# Patient Record
Sex: Female | Born: 1990 | Race: White | Hispanic: No | Marital: Single | Smoking: Current some day smoker
Health system: Southern US, Community
[De-identification: ages and names within clinical notes are randomized; demographics above are authoritative.]

## PROBLEM LIST (undated history)

## (undated) DIAGNOSIS — F419 Anxiety disorder, unspecified: Secondary | ICD-10-CM

## (undated) DIAGNOSIS — F1990 Other psychoactive substance use, unspecified, uncomplicated: Secondary | ICD-10-CM

## (undated) DIAGNOSIS — K8020 Calculus of gallbladder without cholecystitis without obstruction: Secondary | ICD-10-CM

## (undated) DIAGNOSIS — I10 Essential (primary) hypertension: Secondary | ICD-10-CM

---

## 2014-05-30 DIAGNOSIS — I10 Essential (primary) hypertension: Secondary | ICD-10-CM | POA: Insufficient documentation

## 2014-05-30 DIAGNOSIS — E669 Obesity, unspecified: Secondary | ICD-10-CM | POA: Insufficient documentation

## 2014-05-30 DIAGNOSIS — F419 Anxiety disorder, unspecified: Secondary | ICD-10-CM | POA: Insufficient documentation

## 2014-05-30 DIAGNOSIS — F17210 Nicotine dependence, cigarettes, uncomplicated: Secondary | ICD-10-CM | POA: Insufficient documentation

## 2014-05-30 DIAGNOSIS — J309 Allergic rhinitis, unspecified: Secondary | ICD-10-CM | POA: Insufficient documentation

## 2015-12-18 ENCOUNTER — Encounter (HOSPITAL_BASED_OUTPATIENT_CLINIC_OR_DEPARTMENT_OTHER): Payer: Self-pay | Admitting: Emergency Medicine

## 2015-12-18 ENCOUNTER — Emergency Department (HOSPITAL_BASED_OUTPATIENT_CLINIC_OR_DEPARTMENT_OTHER)
Admission: EM | Admit: 2015-12-18 | Discharge: 2015-12-18 | Disposition: A | Discharge status: Home / Self Care | Payer: Self-pay | Attending: Emergency Medicine | Admitting: Emergency Medicine

## 2015-12-18 DIAGNOSIS — F17210 Nicotine dependence, cigarettes, uncomplicated: Secondary | ICD-10-CM | POA: Insufficient documentation

## 2015-12-18 DIAGNOSIS — Z3202 Encounter for pregnancy test, result negative: Secondary | ICD-10-CM | POA: Insufficient documentation

## 2015-12-18 DIAGNOSIS — K5900 Constipation, unspecified: Secondary | ICD-10-CM | POA: Insufficient documentation

## 2015-12-18 DIAGNOSIS — R509 Fever, unspecified: Secondary | ICD-10-CM | POA: Insufficient documentation

## 2015-12-18 DIAGNOSIS — R112 Nausea with vomiting, unspecified: Secondary | ICD-10-CM | POA: Insufficient documentation

## 2015-12-18 DIAGNOSIS — R197 Diarrhea, unspecified: Secondary | ICD-10-CM | POA: Insufficient documentation

## 2015-12-18 DIAGNOSIS — R1012 Left upper quadrant pain: Secondary | ICD-10-CM | POA: Insufficient documentation

## 2015-12-18 LAB — COMPREHENSIVE METABOLIC PANEL
ALK PHOS: 66 U/L (ref 38–126)
ALT: 40 U/L (ref 14–54)
AST: 25 U/L (ref 15–41)
Albumin: 4 g/dL (ref 3.5–5.0)
Anion gap: 6 (ref 5–15)
BILIRUBIN TOTAL: 0.5 mg/dL (ref 0.3–1.2)
BUN: 16 mg/dL (ref 6–20)
CALCIUM: 8.8 mg/dL — AB (ref 8.9–10.3)
CO2: 24 mmol/L (ref 22–32)
CREATININE: 0.58 mg/dL (ref 0.44–1.00)
Chloride: 107 mmol/L (ref 101–111)
GFR calc non Af Amer: >60 mL/min (ref >60)
Glucose, Bld: 113 mg/dL — ABNORMAL HIGH (ref 65–99)
Potassium: 3.9 mmol/L (ref 3.5–5.1)
SODIUM: 137 mmol/L (ref 135–145)
Total Protein: 7.3 g/dL (ref 6.5–8.1)

## 2015-12-18 LAB — URINALYSIS, ROUTINE W REFLEX MICROSCOPIC
Bilirubin Urine: NEGATIVE
Glucose, UA: NEGATIVE mg/dL
KETONES UR: NEGATIVE mg/dL
NITRITE: NEGATIVE
PROTEIN: NEGATIVE mg/dL
Specific Gravity, Urine: 1.022 (ref 1.005–1.030)
pH: 5.5 (ref 5.0–8.0)

## 2015-12-18 LAB — URINE MICROSCOPIC-ADD ON

## 2015-12-18 LAB — CBC WITH DIFFERENTIAL/PLATELET
BASOS PCT: 0 %
Band Neutrophils: 0 %
Basophils Absolute: 0 10*3/uL (ref 0.0–0.1)
Blasts: 0 %
EOS ABS: 0.1 10*3/uL (ref 0.0–0.7)
Eosinophils Relative: 2 %
HCT: 37 % (ref 36.0–46.0)
HEMOGLOBIN: 13.5 g/dL (ref 12.0–15.0)
Lymphocytes Relative: 41 %
Lymphs Abs: 2.3 10*3/uL (ref 0.7–4.0)
MCH: 28.7 pg (ref 26.0–34.0)
MCHC: 36.5 g/dL — ABNORMAL HIGH (ref 30.0–36.0)
MCV: 78.7 fL (ref 78.0–100.0)
MONO ABS: 0.2 10*3/uL (ref 0.1–1.0)
MYELOCYTES: 0 %
Metamyelocytes Relative: 0 %
Monocytes Relative: 3 %
NEUTROS PCT: 54 %
NRBC: 0 /100{WBCs}
Neutro Abs: 2.9 10*3/uL (ref 1.7–7.7)
Other: 0 %
PROMYELOCYTES ABS: 0 %
Platelets: 158 10*3/uL (ref 150–400)
RBC: 4.7 MIL/uL (ref 3.87–5.11)
RDW: 12.8 % (ref 11.5–15.5)
WBC: 5.5 10*3/uL (ref 4.0–10.5)

## 2015-12-18 LAB — LIPASE, BLOOD: LIPASE: 22 U/L (ref 11–51)

## 2015-12-18 LAB — PREGNANCY, URINE: PREG TEST UR: NEGATIVE

## 2015-12-18 MED ORDER — OMEPRAZOLE 20 MG PO CPDR: 20.0000 mg | DELAYED_RELEASE_CAPSULE | Freq: Every day | ORAL | Status: DC

## 2015-12-18 MED FILL — OMEPRAZOLE DR 20 MG CAPSULE: 20 | 30 days supply | Qty: 30 | Fill #0

## 2015-12-18 NOTE — Discharge Instructions (Signed)

## 2015-12-18 NOTE — ED Notes (Signed)
Pt made aware to return if symptoms worsen or if any life threatening symptoms occur.   

## 2015-12-18 NOTE — ED Notes (Signed)
Pt reports left sided abdominal pain radiating to left flank, nausea with vomiting occasionally since last march after she gave birth to daughter.  Pt believes she has gall stones but has not been diagnosed.

## 2015-12-18 NOTE — ED Provider Notes (Signed)
CSN: [id number]     Arrival date & time 12/18/15  0914 History   First MD Initiated Contact with Patient 12/18/15 [phone]     Chief Complaint  Patient presents with  . Abdominal Pain     Patient is a 25 y.o. female presenting with abdominal pain. The history is provided by the patient. No language interpreter was used.  Abdominal Pain  Nichole Curry is a 25 y.o. female who presents to the Emergency Department complaining of abdominal pain.  She reports 1 year of left upper quadrant abdominal pain. The pain is waxing and waning and has been significantly worse over the last week. The pain is described as sharp and rocklike sensation. It starts in her left upper quadrant and radiates through to her back at times. She has nausea and at times makes her self vomit to relieve the nausea. She has occasional diarrhea and constipation. No dysuria. Pain is at times worse after he see or greasy foods. She was [facility] ago and had an exam performed and was started on omeprazole. She has occasional relief when she takes this medicine but only takes it as needed. No chest pain, cough. She's been feeling poorly over the last week and has subjective fever and chills.  History reviewed. No pertinent past medical history. History reviewed. No pertinent past surgical history. History reviewed. No pertinent family history. Social History  Substance Use Topics  . Smoking status: Current Some Day Smoker -- 0.50 packs/day    Types: Cigarettes  . Smokeless tobacco: None  . Alcohol Use: No   OB History    No data available     Review of Systems  Gastrointestinal: Positive for abdominal pain.  All other systems reviewed and are negative.     Allergies  Review of patient's allergies indicates no known allergies.  Home Medications   Prior to Admission medications   Medication Sig Start Date End Date Taking? Authorizing Provider  omeprazole (PRILOSEC) 20 MG capsule Take 1 capsule (20 mg  total) by mouth daily. 12/18/15   Tilden FossaElizabeth Labrisha Wuellner, MD   BP 146/102 mmHg  Pulse 88  Temp(Src) 98.2 F (36.8 C) (Oral)  Resp 16  Ht 5\' 1"  (1.549 m)  Wt 219 lb 7 oz (99.536 kg)  BMI 41.48 kg/m2  SpO2 100%  LMP 12/08/2015 (Exact Date) Physical Exam  Constitutional: She is oriented to person, place, and time. She appears well-developed and well-nourished.  HENT:  Head: Normocephalic and atraumatic.  Cardiovascular: Normal rate and regular rhythm.   No murmur heard. Pulmonary/Chest: Effort normal and breath sounds normal. No respiratory distress.  Abdominal: Soft. There is no rebound and no guarding.  Obese abdomen with mild left upper quadrant tenderness.  Musculoskeletal: She exhibits no edema or tenderness.  Neurological: She is alert and oriented to person, place, and time.  Skin: Skin is warm and dry.  Psychiatric: She has a normal mood and affect. Her behavior is normal.  Nursing note and vitals reviewed.   ED Course  Procedures (including critical care time) Labs Review Labs Reviewed  COMPREHENSIVE METABOLIC PANEL - Abnormal; Notable for the following:    Glucose, Bld 113 (*)    Calcium 8.8 (*)    All other components within normal limits  CBC WITH DIFFERENTIAL/PLATELET - Abnormal; Notable for the following:    MCHC 36.5 (*)    All other components within normal limits  URINALYSIS, ROUTINE W REFLEX MICROSCOPIC (NOT [facility]) - Abnormal; Notable for the following:  APPearance CLOUDY (*)    Hgb urine dipstick TRACE (*)    Leukocytes, UA SMALL (*)    All other components within normal limits  URINE MICROSCOPIC-ADD ON - Abnormal; Notable for the following:    Squamous Epithelial / LPF 6-30 (*)    Bacteria, UA MANY (*)    All other components within normal limits  URINE CULTURE  LIPASE, BLOOD  PREGNANCY, URINE    Imaging Review No results found. I have personally reviewed and evaluated these images and lab results as part of my medical decision-making.   EKG  Interpretation None      MDM   Final diagnoses:  LUQ abdominal pain    Patient here for evaluation of left upper quadrant pain that has been intermittent for last year. Her abdominal examination is benign. UA is with contaminant, no evidence of UTI, culture is pending. Patient with possible gastritis, treated with omeprazole. Discussed outpatient follow-up, home care, return precautions.  Presentation not consistent with cholecystitis, pancreatitis, bowel obstruction, diverticulitis.    Tilden Fossa, MD 12/19/15 [phone]

## 2015-12-19 LAB — URINE CULTURE: CULTURE: NO GROWTH

## 2016-09-09 ENCOUNTER — Encounter (HOSPITAL_BASED_OUTPATIENT_CLINIC_OR_DEPARTMENT_OTHER): Payer: Self-pay

## 2016-09-09 ENCOUNTER — Emergency Department (HOSPITAL_BASED_OUTPATIENT_CLINIC_OR_DEPARTMENT_OTHER)
Admission: EM | Admit: 2016-09-09 | Discharge: 2016-09-09 | Disposition: A | Discharge status: Home / Self Care | Payer: Medicaid Other | Attending: Emergency Medicine | Admitting: Emergency Medicine

## 2016-09-09 DIAGNOSIS — F17210 Nicotine dependence, cigarettes, uncomplicated: Secondary | ICD-10-CM | POA: Insufficient documentation

## 2016-09-09 DIAGNOSIS — N390 Urinary tract infection, site not specified: Secondary | ICD-10-CM | POA: Insufficient documentation

## 2016-09-09 LAB — URINALYSIS, ROUTINE W REFLEX MICROSCOPIC
Bilirubin Urine: NEGATIVE
Glucose, UA: NEGATIVE mg/dL
KETONES UR: NEGATIVE mg/dL
NITRITE: POSITIVE — AB
PROTEIN: 30 mg/dL — AB
Specific Gravity, Urine: 1.028 (ref 1.005–1.030)
pH: 6 (ref 5.0–8.0)

## 2016-09-09 LAB — URINALYSIS, MICROSCOPIC (REFLEX)

## 2016-09-09 LAB — PREGNANCY, URINE: Preg Test, Ur: NEGATIVE

## 2016-09-09 MED ORDER — NITROFURANTOIN MONOHYD MACRO 100 MG PO CAPS: 100.0000 mg | ORAL_CAPSULE | Freq: Two times a day (BID) | ORAL | 0 refills | Status: DC

## 2016-09-09 MED ORDER — PHENAZOPYRIDINE HCL 100 MG PO TABS
200.0000 mg | ORAL_TABLET | Freq: Once | ORAL | Status: AC
  Administered 2016-09-09: 200 mg via ORAL
  Filled 2016-09-09: qty 2

## 2016-09-09 MED ORDER — NITROFURANTOIN MONOHYD MACRO 100 MG PO CAPS
100.0000 mg | ORAL_CAPSULE | Freq: Once | ORAL | Status: AC
  Administered 2016-09-09: 100 mg via ORAL
  Filled 2016-09-09: qty 1

## 2016-09-09 MED ORDER — PHENAZOPYRIDINE HCL 200 MG PO TABS: 200.0000 mg | ORAL_TABLET | Freq: Three times a day (TID) | ORAL | 0 refills | Status: DC

## 2016-09-09 NOTE — ED Triage Notes (Signed)
Pt c/o dysuria for the last four days with urinary frequency and bladder pressure.  She denies flank pain, denies fevers, states it feels like when she has had a UTI in the past.

## 2016-09-09 NOTE — ED Notes (Signed)
Pt verbalizes understanding of d/c instructions and denies any further needs at this time. 

## 2016-09-09 NOTE — ED Provider Notes (Signed)
[facility] Provider Note: Nichole DellJ. Lane Benedetto Ryder, MD, FACEP  CSN: [id number] MRN: [id number] ARRIVAL: 09/09/16 at 0209 ROOM: MH11/MH11   CHIEF COMPLAINT  Dysuria   HISTORY OF PRESENT ILLNESS  Nichole Curry is a 26 y.o. female with a 4-6 day history of lower abdominal pain, urinary urgency, urinary frequency and voiding small amounts. She has also noticed some mild hematuria. She denies back pain, flank pain, fever, chills, vomiting or diarrhea but has had nausea. Symptoms are moderate. She has not taken anything for this. Symptoms are similar to previous urinary tract infections.   History reviewed. No pertinent past medical history.  History reviewed. No pertinent surgical history.  No family history on file.  Social History  Substance Use Topics  . Smoking status: Current Some Day Smoker    Packs/day: 0.50    Types: Cigarettes  . Smokeless tobacco: Not on file  . Alcohol use No    Prior to Admission medications   Medication Sig Start Date End Date Taking? Authorizing Provider  nitrofurantoin, macrocrystal-monohydrate, (MACROBID) 100 MG capsule Take 1 capsule (100 mg total) by mouth 2 (two) times daily. X 7 days 09/09/16   Paula LibraJohn Linell Shawn, MD  omeprazole (PRILOSEC) 20 MG capsule Take 1 capsule (20 mg total) by mouth daily. 12/18/15   Tilden FossaElizabeth Rees, MD    Allergies Patient has no known allergies.   REVIEW OF SYSTEMS  Negative except as noted here or in the History of Present Illness.   PHYSICAL EXAMINATION  Initial Vital Signs Blood pressure 136/66, pulse 92, temperature 98.7 F (37.1 C), temperature source Oral, height 5\' 1"  (1.549 m), weight 215 lb (97.5 kg), last menstrual period 08/27/2016, SpO2 100 %.  Examination General: Well-developed, well-nourished female in no acute distress; appearance consistent with age of record HENT: normocephalic; atraumatic Eyes: pupils equal, round and reactive to light; extraocular muscles intact Neck: supple Heart: regular  rate and rhythm Lungs: clear to auscultation bilaterally Abdomen: soft; nondistended; nontender; no masses or hepatosplenomegaly; bowel sounds present GU: No CVA tenderness Extremities: No deformity; full range of motion; pulses normal Neurologic: Awake, alert and oriented; motor function intact in all extremities and symmetric; no facial droop Skin: Warm and dry Psychiatric: Normal mood and affect   RESULTS  Summary of this visit's results, reviewed by myself:   EKG Interpretation  Date/Time:    Ventricular Rate:    PR Interval:    QRS Duration:   QT Interval:    QTC Calculation:   R Axis:     Text Interpretation:        Laboratory Studies: Results for orders placed or performed during the hospital encounter of 09/09/16 (from the past 24 hour(s))  Urinalysis, Routine w reflex microscopic     Status: Abnormal   Collection Time: 09/09/16  2:18 AM  Result Value Ref Range   Color, Urine YELLOW YELLOW   APPearance CLOUDY (A) CLEAR   Specific Gravity, Urine 1.028 1.005 - 1.030   pH 6.0 5.0 - 8.0   Glucose, UA NEGATIVE NEGATIVE mg/dL   Hgb urine dipstick MODERATE (A) NEGATIVE   Bilirubin Urine NEGATIVE NEGATIVE   Ketones, ur NEGATIVE NEGATIVE mg/dL   Protein, ur 30 (A) NEGATIVE mg/dL   Nitrite POSITIVE (A) NEGATIVE   Leukocytes, UA LARGE (A) NEGATIVE  Pregnancy, urine     Status: None   Collection Time: 09/09/16  2:18 AM  Result Value Ref Range   Preg Test, Ur NEGATIVE NEGATIVE  Urinalysis, Microscopic (reflex)     Status:  Abnormal   Collection Time: 09/09/16  2:18 AM  Result Value Ref Range   RBC / HPF 6-30 0 - 5 RBC/hpf   WBC, UA TOO NUMEROUS TO COUNT 0 - 5 WBC/hpf   Bacteria, UA MANY (A) NONE SEEN   Squamous Epithelial / LPF 0-5 (A) NONE SEEN   Imaging Studies: No results found.  ED COURSE  Nursing notes and initial vitals signs, including pulse oximetry, reviewed.  Vitals:   09/09/16 0216 09/09/16 0217  BP: 136/66   Pulse: 92   Temp: 98.7 F (37.1 C)     TempSrc: Oral   SpO2: 100%   Weight:  215 lb (97.5 kg)  Height:  5\' 1"  (1.549 m)    PROCEDURES    ED DIAGNOSES     ICD-9-CM ICD-10-CM   1. Lower urinary tract infectious disease 599.0 N39.0        [name], MD 09/09/16 [phone]

## 2016-09-11 LAB — URINE CULTURE: Culture: >=100000 — AB

## 2016-09-12 ENCOUNTER — Telehealth (HOSPITAL_BASED_OUTPATIENT_CLINIC_OR_DEPARTMENT_OTHER): Payer: Self-pay | Admitting: *Deleted

## 2016-09-12 NOTE — Telephone Encounter (Signed)
Post ED Visit - Positive Culture Follow-up  Culture report reviewed by antimicrobial stewardship pharmacist:  []  Enzo BiNathan Batchelder, Pharm.D. []  Celedonio MiyamotoJeremy Frens, Pharm.D., BCPS []  Garvin FilaMike Maccia, Pharm.D. []  Georgina PillionElizabeth Martin, Pharm.D., BCPS []  SheffieldMinh Pham, [address]Ph[address]D., BCPS, AAHIVP []  Estella HuskMichelle Turner, Pharm.D., BCPS, AAHIVP []  Tennis Mustassie Stewart, Pharm.D. []  Sherle Poeob Vincent, [address]Ph[address] Maggie Shuda, Pharm D  Positive urine culture Treated with Nitrofurantoin Monohyd Macro, organism sensitive to the same and no further patient follow-up is required at this time.  Virl AxeRobertson, Ej Pinson [facility] 09/12/2016, 9:19 AM

## 2018-01-21 ENCOUNTER — Emergency Department (HOSPITAL_BASED_OUTPATIENT_CLINIC_OR_DEPARTMENT_OTHER)
Admission: EM | Admit: 2018-01-21 | Discharge: 2018-01-22 | Disposition: A | Discharge status: Home / Self Care | Payer: Medicaid Other | Attending: Emergency Medicine | Admitting: Emergency Medicine

## 2018-01-21 ENCOUNTER — Other Ambulatory Visit: Payer: Self-pay

## 2018-01-21 ENCOUNTER — Encounter (HOSPITAL_BASED_OUTPATIENT_CLINIC_OR_DEPARTMENT_OTHER): Payer: Self-pay | Admitting: *Deleted

## 2018-01-21 ENCOUNTER — Emergency Department (HOSPITAL_BASED_OUTPATIENT_CLINIC_OR_DEPARTMENT_OTHER): Payer: Medicaid Other

## 2018-01-21 DIAGNOSIS — F17210 Nicotine dependence, cigarettes, uncomplicated: Secondary | ICD-10-CM | POA: Insufficient documentation

## 2018-01-21 DIAGNOSIS — Z79899 Other long term (current) drug therapy: Secondary | ICD-10-CM | POA: Diagnosis not present

## 2018-01-21 DIAGNOSIS — M545 Low back pain, unspecified: Secondary | ICD-10-CM

## 2018-01-21 LAB — URINALYSIS, ROUTINE W REFLEX MICROSCOPIC
Bilirubin Urine: NEGATIVE
Glucose, UA: NEGATIVE mg/dL
Ketones, ur: NEGATIVE mg/dL
LEUKOCYTES UA: NEGATIVE
Nitrite: NEGATIVE
PROTEIN: NEGATIVE mg/dL
SPECIFIC GRAVITY, URINE: 1.025 (ref 1.005–1.030)
pH: 6 (ref 5.0–8.0)

## 2018-01-21 LAB — URINALYSIS, MICROSCOPIC (REFLEX)

## 2018-01-21 LAB — PREGNANCY, URINE: PREG TEST UR: NEGATIVE

## 2018-01-21 NOTE — ED Provider Notes (Signed)
[facility] EMERGENCY DEPARTMENT Provider Note   CSN: [id number] Arrival date & time: 01/21/18  2132     History   Chief Complaint Chief Complaint  Patient presents with  . Back Pain    HPI Nichole Curry is a 27 y.o. female.  The history is provided by the patient. No language interpreter was used.  Back Pain   This is a new problem. The problem occurs constantly. The problem has been gradually worsening. The pain is associated with falling. The pain is present in the sacro-iliac joint. The quality of the pain is described as aching. The pain does not radiate. The pain is moderate. Pertinent negatives include no paresthesias and no paresis. She has tried nothing for the symptoms.  Pt fell a week ago and landed on her bottom,  Pt reports she has a knot and is still having pain   History reviewed. No pertinent past medical history.  There are no active problems to display for this patient.   Past Surgical History:  Procedure Laterality Date  . CESAREAN SECTION       OB History   None      Home Medications    Prior to Admission medications   Medication Sig Start Date End Date Taking? Authorizing Provider  nitrofurantoin, macrocrystal-monohydrate, (MACROBID) 100 MG capsule Take 1 capsule (100 mg total) by mouth 2 (two) times daily. X 7 days 09/09/16   Molpus, John, MD  omeprazole (PRILOSEC) 20 MG capsule Take 1 capsule (20 mg total) by mouth daily. 12/18/15   Tilden Fossa, MD  phenazopyridine (PYRIDIUM) 200 MG tablet Take 1 tablet (200 mg total) by mouth 3 (three) times daily. 09/09/16   Molpus, John, MD    Family History No family history on file.  Social History Social History   Tobacco Use  . Smoking status: Current Some Day Smoker    Packs/day: 0.50    Types: Cigarettes  . Smokeless tobacco: Never Used  Substance Use Topics  . Alcohol use: Yes    Comment: occasional  . Drug use: No     Allergies   Patient has no known  allergies.   Review of Systems Review of Systems  Musculoskeletal: Positive for back pain.  Neurological: Negative for paresthesias.  All other systems reviewed and are negative.    Physical Exam Updated Vital Signs BP 128/73 (BP Location: Right Arm)   Pulse (!) 104   Temp 98.4 F (36.9 C) (Oral)   Resp 20   Ht  (1.549 m)   Wt 98.9 kg (218 lb 0.6 oz)   LMP 01/12/2018   SpO2 98%   BMI 41.20 kg/m   Physical Exam  Constitutional: She appears well-developed and well-nourished.  Eyes: Pupils are equal, round, and reactive to light.  Cardiovascular: Normal rate.  Pulmonary/Chest: Effort normal.  Abdominal: Soft.  Musculoskeletal: Normal range of motion.  Neurological: She is alert.  Skin: Skin is warm.  Psychiatric: She has a normal mood and affect.  Nursing note and vitals reviewed.    ED Treatments / Results  Labs (all labs ordered are listed, but only abnormal results are displayed) Labs Reviewed  URINALYSIS, ROUTINE W REFLEX MICROSCOPIC - Abnormal; Notable for the following components:      Result Value   APPearance HAZY (*)    Hgb urine dipstick TRACE (*)    All other components within normal limits  URINALYSIS, MICROSCOPIC (REFLEX) - Abnormal; Notable for the following components:   Bacteria, UA MANY (*)  All other components within normal limits  PREGNANCY, URINE    EKG None  Radiology Dg Sacrum/coccyx  Result Date: 01/21/2018 CLINICAL DATA:  Fall 7-10 days ago with low back pain. EXAM: SACRUM AND COCCYX - 2+ VIEW COMPARISON:  None. FINDINGS: There is no evidence of fracture or other focal bone lesions. SI joints are symmetric and unremarkable. IMPRESSION: Negative. Electronically Signed   By: Charlett Nose M.D.   On: 01/21/2018 23:54    Procedures Procedures (including critical care time)  Medications Ordered in ED Medications - No data to display   Initial Impression / Assessment and Plan / ED Course  I have reviewed the triage vital  signs and the nursing notes.  Pertinent labs & imaging results that were available during my care of the patient were reviewed by me and considered in my medical decision making (see chart for details).     Sacraum and coccys no fracture.   Final Clinical Impressions(s) / ED Diagnoses   Final diagnoses:  Low back pain without sciatica, unspecified back pain laterality, unspecified chronicity    ED Discharge Orders        Ordered    diclofenac (VOLTAREN) 75 MG EC tablet  2 times daily     01/22/18 0004    methocarbamol (ROBAXIN) 500 MG tablet  2 times daily     01/22/18 0004    An After Visit Summary was printed and given to the patient.    Elson Areas, PA-C 01/22/18 0005    Shon Baton, MD 01/22/18 [phone]

## 2018-01-21 NOTE — ED Triage Notes (Signed)
Pt states she had a hard fall 1 week ago and landed on a tile floor. States pain is worse now than after the initial injury. Ambulatory to triage

## 2018-01-22 MED ORDER — DICLOFENAC SODIUM 75 MG PO TBEC: 75.0000 mg | DELAYED_RELEASE_TABLET | Freq: Two times a day (BID) | ORAL | 0 refills | Status: DC

## 2018-01-22 MED ORDER — METHOCARBAMOL 500 MG PO TABS
750.0000 mg | ORAL_TABLET | Freq: Once | ORAL | Status: AC
  Administered 2018-01-22: 750 mg via ORAL
  Filled 2018-01-22: qty 2

## 2018-01-22 MED ORDER — METHOCARBAMOL 500 MG PO TABS: 500.0000 mg | ORAL_TABLET | Freq: Two times a day (BID) | ORAL | 0 refills | Status: DC

## 2018-01-22 MED ORDER — IBUPROFEN 800 MG PO TABS
800.0000 mg | ORAL_TABLET | Freq: Once | ORAL | Status: AC
  Administered 2018-01-22: 800 mg via ORAL
  Filled 2018-01-22: qty 1

## 2018-01-22 NOTE — Discharge Instructions (Addendum)
See your Physician for recheck in 3-4 days °

## 2018-05-10 ENCOUNTER — Encounter (HOSPITAL_BASED_OUTPATIENT_CLINIC_OR_DEPARTMENT_OTHER): Payer: Self-pay

## 2018-05-10 ENCOUNTER — Other Ambulatory Visit: Payer: Self-pay

## 2018-05-10 DIAGNOSIS — J029 Acute pharyngitis, unspecified: Secondary | ICD-10-CM | POA: Insufficient documentation

## 2018-05-10 DIAGNOSIS — Z5321 Procedure and treatment not carried out due to patient leaving prior to being seen by health care provider: Secondary | ICD-10-CM | POA: Insufficient documentation

## 2018-05-10 DIAGNOSIS — R0981 Nasal congestion: Secondary | ICD-10-CM | POA: Insufficient documentation

## 2018-05-10 DIAGNOSIS — R05 Cough: Secondary | ICD-10-CM | POA: Insufficient documentation

## 2018-05-10 NOTE — ED Triage Notes (Signed)
Pt c/o cough, congestion, and sore throat since last Friday. Pt A+OX4, NAD.

## 2018-05-11 ENCOUNTER — Emergency Department (HOSPITAL_BASED_OUTPATIENT_CLINIC_OR_DEPARTMENT_OTHER)
Admission: EM | Admit: 2018-05-11 | Discharge: 2018-05-11 | Disposition: A | Discharge status: Home / Self Care | Payer: Medicaid Other | Attending: Emergency Medicine | Admitting: Emergency Medicine

## 2018-05-11 LAB — GROUP A STREP BY PCR: GROUP A STREP BY PCR: NOT DETECTED

## 2018-05-11 NOTE — ED Notes (Signed)
Patient left prior to being seen by EDP.

## 2019-02-27 DIAGNOSIS — F411 Generalized anxiety disorder: Secondary | ICD-10-CM | POA: Insufficient documentation

## 2019-02-27 DIAGNOSIS — F331 Major depressive disorder, recurrent, moderate: Secondary | ICD-10-CM | POA: Insufficient documentation

## 2019-02-27 DIAGNOSIS — F1120 Opioid dependence, uncomplicated: Secondary | ICD-10-CM | POA: Insufficient documentation

## 2019-10-23 ENCOUNTER — Encounter (HOSPITAL_BASED_OUTPATIENT_CLINIC_OR_DEPARTMENT_OTHER): Payer: Self-pay

## 2019-10-23 ENCOUNTER — Emergency Department (HOSPITAL_BASED_OUTPATIENT_CLINIC_OR_DEPARTMENT_OTHER)
Admission: EM | Admit: 2019-10-23 | Discharge: 2019-10-23 | Disposition: A | Discharge status: Home / Self Care | Payer: Self-pay | Attending: Emergency Medicine | Admitting: Emergency Medicine

## 2019-10-23 ENCOUNTER — Emergency Department (HOSPITAL_BASED_OUTPATIENT_CLINIC_OR_DEPARTMENT_OTHER): Payer: Self-pay

## 2019-10-23 ENCOUNTER — Other Ambulatory Visit: Payer: Self-pay

## 2019-10-23 DIAGNOSIS — F1910 Other psychoactive substance abuse, uncomplicated: Secondary | ICD-10-CM

## 2019-10-23 DIAGNOSIS — N898 Other specified noninflammatory disorders of vagina: Secondary | ICD-10-CM

## 2019-10-23 DIAGNOSIS — F1110 Opioid abuse, uncomplicated: Secondary | ICD-10-CM | POA: Insufficient documentation

## 2019-10-23 DIAGNOSIS — Z20822 Contact with and (suspected) exposure to covid-19: Secondary | ICD-10-CM | POA: Insufficient documentation

## 2019-10-23 DIAGNOSIS — Z793 Long term (current) use of hormonal contraceptives: Secondary | ICD-10-CM | POA: Insufficient documentation

## 2019-10-23 DIAGNOSIS — F17210 Nicotine dependence, cigarettes, uncomplicated: Secondary | ICD-10-CM | POA: Insufficient documentation

## 2019-10-23 DIAGNOSIS — N899 Noninflammatory disorder of vagina, unspecified: Secondary | ICD-10-CM | POA: Insufficient documentation

## 2019-10-23 HISTORY — DX: Anxiety disorder, unspecified: F41.9

## 2019-10-23 HISTORY — DX: Calculus of gallbladder without cholecystitis without obstruction: K80.20

## 2019-10-23 HISTORY — DX: Other psychoactive substance use, unspecified, uncomplicated: F19.90

## 2019-10-23 LAB — URINALYSIS, MICROSCOPIC (REFLEX)

## 2019-10-23 LAB — WET PREP, GENITAL
Sperm: NONE SEEN
Trich, Wet Prep: NONE SEEN
Yeast Wet Prep HPF POC: NONE SEEN

## 2019-10-23 LAB — URINALYSIS, ROUTINE W REFLEX MICROSCOPIC
Bilirubin Urine: NEGATIVE
Glucose, UA: NEGATIVE mg/dL
Hgb urine dipstick: NEGATIVE
Ketones, ur: 15 mg/dL — AB
Nitrite: NEGATIVE
Protein, ur: NEGATIVE mg/dL
Specific Gravity, Urine: 1.02 (ref 1.005–1.030)
pH: 8 (ref 5.0–8.0)

## 2019-10-23 LAB — PREGNANCY, URINE: Preg Test, Ur: NEGATIVE

## 2019-10-23 LAB — SARS CORONAVIRUS 2 (TAT 6-24 HRS): SARS Coronavirus 2: NEGATIVE

## 2019-10-23 NOTE — ED Notes (Signed)
ED Provider at bedside. 

## 2019-10-23 NOTE — ED Triage Notes (Signed)
Pt c/o flu like sx, dizziness started yesterday-also c/o vaginal d/c x 5 months-NAD-steady gait

## 2019-10-23 NOTE — Discharge Instructions (Signed)
[employer]'s National Helpline - 1-800-662-HELP 985 474 6280) for substance abuse and assistance.   We will call you with any positive results. Please quarantine until you know your results of your testing.   You may follow up with the health department in the future for free STD testing.

## 2019-10-23 NOTE — ED Provider Notes (Signed)
[facility] EMERGENCY DEPARTMENT Provider Note   CSN: [id number] Arrival date & time: 10/23/19  1201     History Chief Complaint  Patient presents with  . Cough  . Vaginal Discharge    Nichole Curry is a 29 y.o. female.  The patient is a 29 year old female with past medical history of anxiety and drug use presenting to the emergency department for cough and URI symptoms as well as vaginal discharge and substance abuse.  Patient reports that her main complaint was that last night she began to have Covid symptoms of cough, fever, chills, nausea, postnasal drip and sore throat.  Reports that she needs a note for work.  She also reports over the past 5 months she has had vaginal discharge and vaginal odor.  She notes that it has actually improved over the past 5 months but she is concerned for STD.  Reports she has not seen another doctor because she does not have insurance.  Her third complaint is that she would like resources for outpatient Suboxone treatment for her drug addiction.        Past Medical History:  Diagnosis Date  . Anxiety   . Drug use   . Gallstones     There are no problems to display for this patient.   Past Surgical History:  Procedure Laterality Date  . CESAREAN SECTION       OB History   No obstetric history on file.     No family history on file.  Social History   Tobacco Use  . Smoking status: Current Some Day Smoker    Packs/day: 0.50    Types: Cigarettes  . Smokeless tobacco: Never Used  Substance Use Topics  . Alcohol use: Not Currently  . Drug use: Yes    Types: IV    Home Medications Prior to Admission medications   Medication Sig Start Date End Date Taking? Authorizing Provider  diclofenac (VOLTAREN) 75 MG EC tablet Take 1 tablet (75 mg total) by mouth 2 (two) times daily. 01/22/18   Elson Areas, PA-C  methocarbamol (ROBAXIN) 500 MG tablet Take 1 tablet (500 mg total) by mouth 2 (two) times daily. 01/22/18    Elson Areas, PA-C  norethindrone-ethinyl estradiol-iron (ESTROSTEP FE,TILIA FE,TRI-LEGEST FE) 1-20/1-30/1-35 MG-MCG tablet Take 1 tablet by mouth daily.    [provider]    Allergies    Patient has no known allergies.  Review of Systems   Review of Systems  Constitutional: Positive for chills, fatigue and fever. Negative for appetite change and diaphoresis.  HENT: Positive for congestion, postnasal drip, rhinorrhea and sore throat. Negative for ear pain and sinus pain.   Eyes: Negative for visual disturbance.  Respiratory: Positive for cough and wheezing. Negative for shortness of breath.   Cardiovascular: Negative for chest pain.  Gastrointestinal: Positive for nausea. Negative for abdominal pain and vomiting.  Genitourinary: Positive for vaginal discharge. Negative for decreased urine volume, difficulty urinating, flank pain, frequency, hematuria, menstrual problem, pelvic pain, urgency, vaginal bleeding and vaginal pain.  Musculoskeletal: Negative for arthralgias, neck pain and neck stiffness.  Skin: Negative for rash and wound.  Neurological: Positive for dizziness and headaches. Negative for light-headedness.    Physical Exam Updated Vital Signs BP (!) 156/95 (BP Location: Right Arm)   Pulse (!) 108   Temp 98.5 F (36.9 C) (Oral)   Resp 16   Ht 5\' 1"  (1.549 m)   Wt 105.7 kg   LMP 10/01/2019   SpO2 97%  BMI 44.02 kg/m   Physical Exam Vitals and nursing note reviewed. Exam conducted with a chaperone present.  Constitutional:      General: She is not in acute distress.    Appearance: Normal appearance. She is obese. She is not ill-appearing, toxic-appearing or diaphoretic.  HENT:     Head: Normocephalic.     Mouth/Throat:     Mouth: Mucous membranes are moist.  Eyes:     Conjunctiva/sclera: Conjunctivae normal.  Cardiovascular:     Rate and Rhythm: Normal rate and regular rhythm.     Pulses: Normal pulses.     Heart sounds: Normal heart sounds.    Pulmonary:     Effort: Pulmonary effort is normal.     Breath sounds: Normal breath sounds.  Abdominal:     General: Bowel sounds are normal.     Tenderness: There is no abdominal tenderness. There is no right CVA tenderness, left CVA tenderness or guarding.  Genitourinary:    Vagina: Vaginal discharge (white) present. No erythema, tenderness, bleeding or lesions.     Cervix: Normal.  Skin:    General: Skin is warm and dry.     Capillary Refill: Capillary refill takes less than 2 seconds.  Neurological:     Mental Status: She is alert.  Psychiatric:        Mood and Affect: Mood normal.     ED Results / Procedures / Treatments   Labs (all labs ordered are listed, but only abnormal results are displayed) Labs Reviewed  URINALYSIS, ROUTINE W REFLEX MICROSCOPIC - Abnormal; Notable for the following components:      Result Value   APPearance HAZY (*)    Ketones, ur 15 (*)    Leukocytes,Ua TRACE (*)    All other components within normal limits  URINALYSIS, MICROSCOPIC (REFLEX) - Abnormal; Notable for the following components:   Bacteria, UA RARE (*)    All other components within normal limits  WET PREP, GENITAL  SARS CORONAVIRUS 2 (TAT 6-24 HRS)  PREGNANCY, URINE  GC/CHLAMYDIA PROBE AMP ([place]) NOT AT [facility]    EKG None  Radiology DG Chest Portable 1 View  Result Date: 10/23/2019 CLINICAL DATA:  Dizziness EXAM: PORTABLE CHEST 1 VIEW COMPARISON:  None. FINDINGS: Heart and mediastinal contours are within normal limits. No focal opacities or effusions. No acute bony abnormality. IMPRESSION: No active disease. Electronically Signed   By: Rolm Baptise M.D.   On: 10/23/2019 13:41    Procedures Procedures (including critical care time)  Medications Ordered in ED Medications - No data to display  ED Course  I have reviewed the triage vital signs and the nursing notes.  Pertinent labs & imaging results that were available during my care of the patient were reviewed by  me and considered in my medical decision making (see chart for details).  Clinical Course as of Oct 22 1440  Wed Oct 23, 2019  1440 Patient here with multiple complaints including possible covid, vaginal discharge and requesting substance abuse treatment. No dysuria. Patient left before workup complete due to "family emergency". Advised her on return precautions. Advised her on isolation and quarantine and avoiding sex due to symptoms until she knows her results. She was referred to outpatient treatment center.    [KM]    Clinical Course User Index [KM] Kristine Royal   MDM Rules/Calculators/A&P                      Based on review  of vitals, medical screening exam, lab work and/or imaging, there does not appear to be an acute, emergent etiology for the patient's symptoms. Counseled pt on good return precautions and encouraged both PCP and ED follow-up as needed.  Prior to discharge, I also discussed incidental imaging findings with patient in detail and advised appropriate, recommended follow-up in detail.  Clinical Impression: 1. Suspected COVID-19 virus infection   2. Vaginal discharge   3. Substance abuse (HCC)     Disposition: Discharge  Prior to providing a prescription for a controlled substance, I independently reviewed the patient's recent prescription history on the [place] Controlled Substance Reporting System. The patient had no recent or regular prescriptions and was deemed appropriate for a brief, less than 3 day prescription of narcotic for acute analgesia.  This note was prepared with assistance of Conservation officer, historic buildings. Occasional wrong-word or sound-a-like substitutions may have occurred due to the inherent limitations of voice recognition software.  Final Clinical Impression(s) / ED Diagnoses Final diagnoses:  Suspected COVID-19 virus infection  Vaginal discharge  Substance abuse [facility])    Rx / DC Orders ED Discharge Orders    None         Jeral Pinch 10/23/19 1442    Raeford Razor, MD 10/24/19 [phone]

## 2019-10-24 ENCOUNTER — Telehealth (HOSPITAL_BASED_OUTPATIENT_CLINIC_OR_DEPARTMENT_OTHER): Payer: Self-pay | Admitting: Physician Assistant

## 2019-10-24 NOTE — Telephone Encounter (Signed)
Patient left prior to getting results at ER visit yesterday. I called to discuss results. Covid test negative, wet prep showing clue cells and WBC. STD testing still pending. No answer on phone call and no voicemail set up to leave voicemail.

## 2019-10-25 LAB — GC/CHLAMYDIA PROBE AMP (~~LOC~~) NOT AT ARMC
Chlamydia: NEGATIVE
Neisseria Gonorrhea: NEGATIVE

## 2020-09-12 IMAGING — DX DG CHEST 1V PORT
1 series · 1 of 1 positions shown · non-contrast
Comparison: None.

CLINICAL DATA: Dizziness

EXAM:
PORTABLE CHEST 1 VIEW

[chest ap]
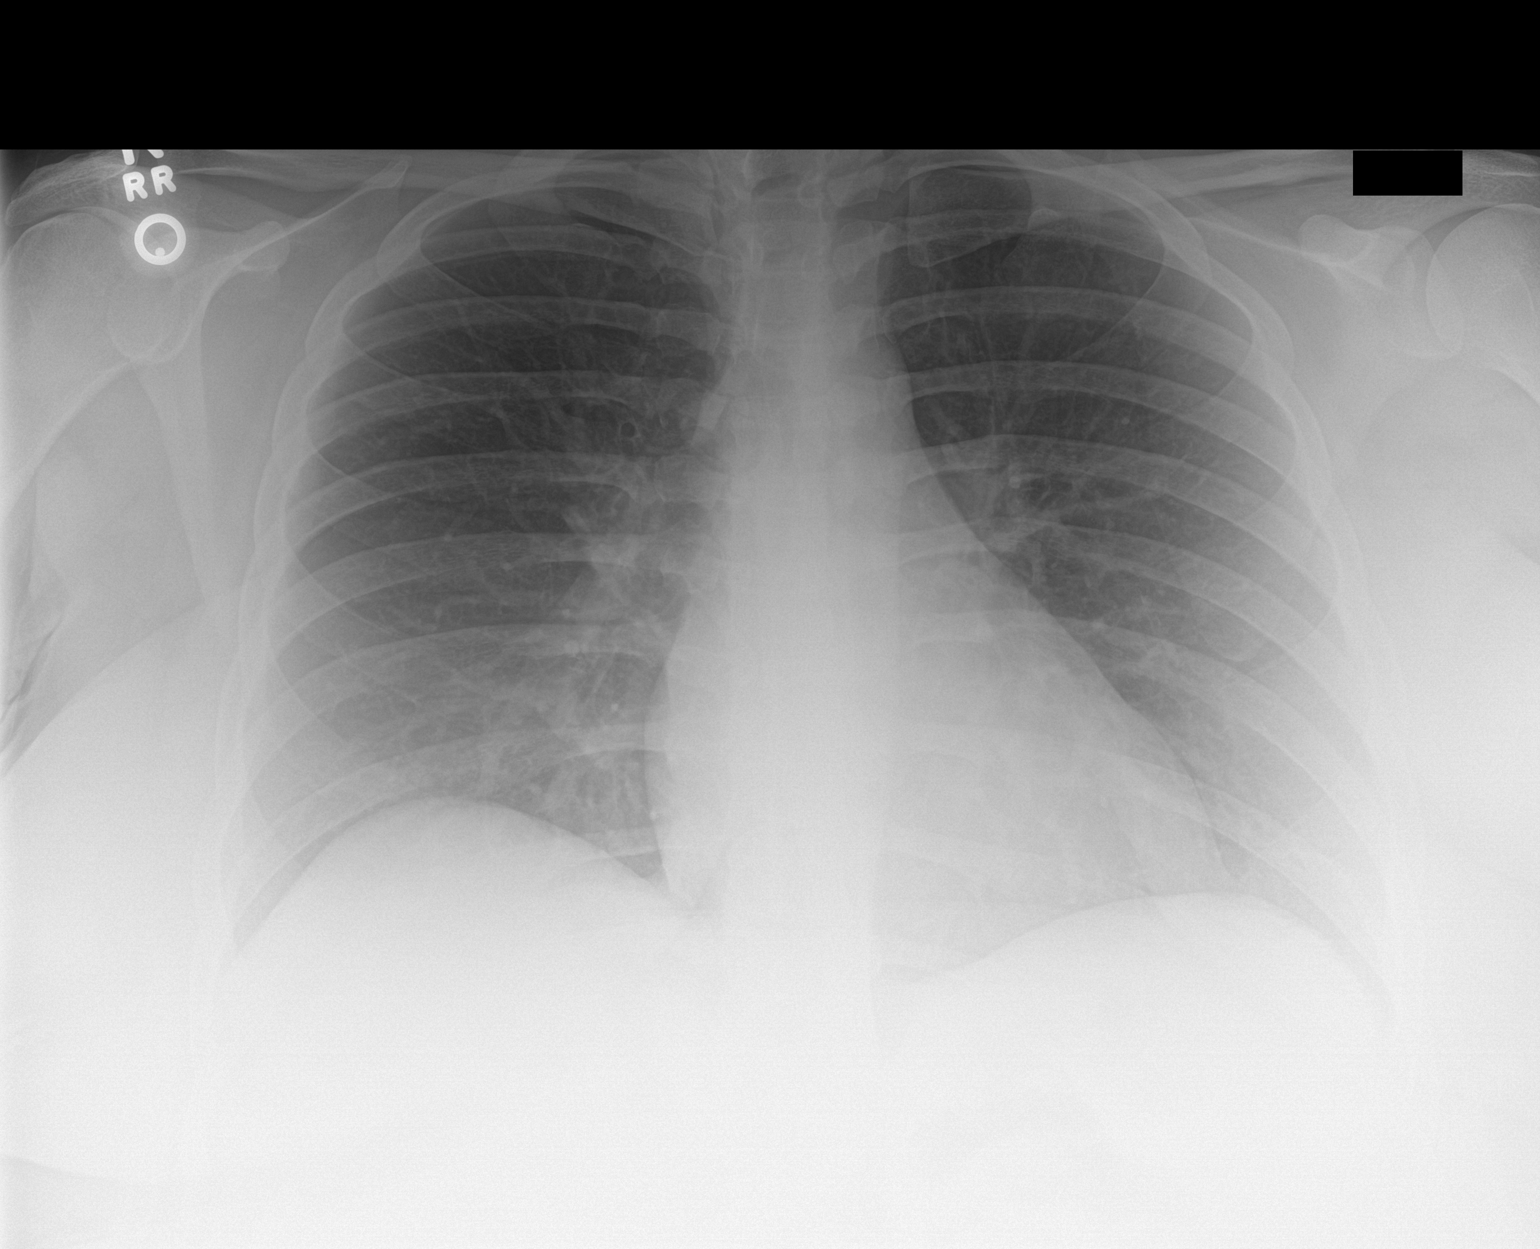

[1 of 1 positions shown; findings below may reference images not displayed]

FINDINGS: Heart and mediastinal contours are within normal limits. No focal
opacities or effusions. No acute bony abnormality.
IMPRESSION: No active disease.

## 2020-12-20 ENCOUNTER — Other Ambulatory Visit: Payer: Self-pay

## 2020-12-20 ENCOUNTER — Emergency Department (HOSPITAL_BASED_OUTPATIENT_CLINIC_OR_DEPARTMENT_OTHER)
Admission: EM | Admit: 2020-12-20 | Discharge: 2020-12-20 | Disposition: A | Discharge status: Home / Self Care | Payer: Self-pay | Attending: Emergency Medicine | Admitting: Emergency Medicine

## 2020-12-20 ENCOUNTER — Encounter (HOSPITAL_BASED_OUTPATIENT_CLINIC_OR_DEPARTMENT_OTHER): Payer: Self-pay | Admitting: *Deleted

## 2020-12-20 DIAGNOSIS — F17210 Nicotine dependence, cigarettes, uncomplicated: Secondary | ICD-10-CM | POA: Insufficient documentation

## 2020-12-20 DIAGNOSIS — T7840XA Allergy, unspecified, initial encounter: Secondary | ICD-10-CM | POA: Insufficient documentation

## 2020-12-20 DIAGNOSIS — I10 Essential (primary) hypertension: Secondary | ICD-10-CM | POA: Insufficient documentation

## 2020-12-20 DIAGNOSIS — R220 Localized swelling, mass and lump, head: Secondary | ICD-10-CM | POA: Insufficient documentation

## 2020-12-20 DIAGNOSIS — X58XXXA Exposure to other specified factors, initial encounter: Secondary | ICD-10-CM | POA: Insufficient documentation

## 2020-12-20 HISTORY — DX: Essential (primary) hypertension: I10

## 2020-12-20 MED ORDER — EPINEPHRINE 0.3 MG/0.3ML IJ SOAJ: 0.3000 mg | INTRAMUSCULAR | 0 refills | Status: AC | PRN

## 2020-12-20 NOTE — Discharge Instructions (Signed)

## 2020-12-20 NOTE — ED Triage Notes (Signed)
Pt reports lip and facial swelling after eating dinner tonight. Took one benadryl with some relief. Pt is in treatment at [facility]. Denies throat swelling

## 2020-12-20 NOTE — ED Provider Notes (Signed)
Emergency Department Provider Note   I have reviewed the triage vital signs and the nursing notes.   HISTORY  Chief Complaint Allergic Reaction   HPI Nichole Curry is a 30 y.o. female presents to the ED for evaluation of lip swelling earlier this evening. She has since taken benadryl and symptoms have resolved. She never developed hives, tongue, swelling, or throat tightness. No SOB or GI symptoms. She is feeling well now. She has some dietary allergies but no known exposures. She is currently undergoing treatment at [facility]. No new medications.   Past Medical History:  Diagnosis Date  . Anxiety   . Drug use   . Gallstones   . Hypertension     There are no problems to display for this patient.   Past Surgical History:  Procedure Laterality Date  . CESAREAN SECTION      Allergies Black walnut pollen allergy skin test and Pecan extract allergy skin test  No family history on file.  Social History Social History   Tobacco Use  . Smoking status: Current Some Day Smoker    Packs/day: 0.50    Types: Cigarettes  . Smokeless tobacco: Never Used  Vaping Use  . Vaping Use: Never used  Substance Use Topics  . Alcohol use: Not Currently  . Drug use: Not Currently    Types: IV    Comment: heroin, crack    Review of Systems  Constitutional: No fever/chills Eyes: No visual changes. ENT: No sore throat. Positive lip swelling (resolved).  Cardiovascular: Denies chest pain. Respiratory: Denies shortness of breath. Gastrointestinal: No abdominal pain.  No nausea, no vomiting.  No diarrhea.  No constipation. Genitourinary: Negative for dysuria. Musculoskeletal: Negative for back pain. Skin: Negative for rash. Neurological: Negative for headaches, focal weakness or numbness.  10-point ROS otherwise negative.  ____________________________________________   PHYSICAL EXAM:  VITAL SIGNS: ED Triage Vitals  Enc Vitals Group     BP 12/20/20 1840 (!) 151/109      Pulse Rate 12/20/20 1840 (!) 103     Resp 12/20/20 1840 18     Temp 12/20/20 1840 99.3 F (37.4 C)     Temp Source 12/20/20 1840 Oral     SpO2 12/20/20 1840 99 %     Weight 12/20/20 1833 240 lb (108.9 kg)     Height 12/20/20 1833 5\' 1"  (1.549 m)   Constitutional: Alert and oriented. Well appearing and in no acute distress. Eyes: Conjunctivae are normal.  Head: Atraumatic. Nose: No congestion/rhinnorhea. Mouth/Throat: Mucous membranes are moist.  Oropharynx non-erythematous. No lip or tongue swelling.  Neck: No stridor.   Cardiovascular: Normal rate, regular rhythm. Good peripheral circulation. Grossly normal heart sounds.   Respiratory: Normal respiratory effort.  No retractions. Lungs CTAB. Gastrointestinal: Soft and nontender. No distention.  Musculoskeletal: No lower extremity tenderness nor edema. No gross deformities of extremities. Neurologic:  Normal speech and language. No gross focal neurologic deficits are appreciated.  Skin:  Skin is warm, dry and intact. No rash noted.  ____________________________________________   PROCEDURES  Procedure(s) performed:   Procedures  None ____________________________________________   INITIAL IMPRESSION / ASSESSMENT AND PLAN / ED COURSE  Pertinent labs & imaging results that were available during my care of the patient were reviewed by me and considered in my medical decision making (see chart for details).   Patient presents to the ED with mild allergic reaction symptoms not consistent with anaphylaxis. She was observed in the ED and continues to improve. Plan to continue benadryl  PRN. No indication for steroids at this time with no lingering symptoms. Discharged home with Rx for EpiPen and instructions on use as well as return precautions.    ____________________________________________  FINAL CLINICAL IMPRESSION(S) / ED DIAGNOSES  Final diagnoses:  Allergic reaction, initial encounter     NEW OUTPATIENT MEDICATIONS  STARTED DURING THIS VISIT:  Discharge Medication List as of 12/20/2020  7:29 PM    START taking these medications   Details  EPINEPHrine 0.3 mg/0.3 mL IJ SOAJ injection Inject 0.3 mg into the muscle as needed for anaphylaxis., Starting Sun 12/20/2020, Print        Note:  This document was prepared using Dragon voice recognition software and may include unintentional dictation errors.  Alona Bene, MD, [facility] Emergency Medicine    Nafeesah Lapaglia, Arlyss Repress, MD 12/21/20 [phone]

## 2020-12-23 ENCOUNTER — Ambulatory Visit: Payer: Self-pay | Admitting: Critical Care Medicine

## 2020-12-23 ENCOUNTER — Other Ambulatory Visit: Payer: Self-pay

## 2020-12-23 ENCOUNTER — Other Ambulatory Visit: Payer: Self-pay | Admitting: Critical Care Medicine

## 2020-12-23 VITALS — BP 148/98 | HR 104 | Temp 98.2°F | Resp 18 | Ht 61.0 in | Wt 242.0 lb

## 2020-12-23 DIAGNOSIS — F1120 Opioid dependence, uncomplicated: Secondary | ICD-10-CM

## 2020-12-23 DIAGNOSIS — F331 Major depressive disorder, recurrent, moderate: Secondary | ICD-10-CM

## 2020-12-23 DIAGNOSIS — F411 Generalized anxiety disorder: Secondary | ICD-10-CM

## 2020-12-23 DIAGNOSIS — N912 Amenorrhea, unspecified: Secondary | ICD-10-CM

## 2020-12-23 DIAGNOSIS — I10 Essential (primary) hypertension: Secondary | ICD-10-CM

## 2020-12-23 LAB — POCT URINALYSIS DIP (CLINITEK)
Bilirubin, UA: NEGATIVE
Blood, UA: NEGATIVE
Glucose, UA: NEGATIVE mg/dL
Ketones, POC UA: NEGATIVE mg/dL
Nitrite, UA: NEGATIVE
POC PROTEIN,UA: NEGATIVE
Spec Grav, UA: >=1.03 — AB
Urobilinogen, UA: 0.2 U/dL
pH, UA: 7

## 2020-12-23 LAB — POCT URINE PREGNANCY: Preg Test, Ur: NEGATIVE

## 2020-12-23 MED ORDER — ESCITALOPRAM OXALATE 10 MG PO TABS: 10.0000 mg | ORAL_TABLET | Freq: Every day | ORAL | 1 refills | Status: DC

## 2020-12-23 MED ORDER — MELATONIN 5 MG PO CAPS: 10.0000 mg | ORAL_CAPSULE | Freq: Every evening | ORAL | 0 refills | Status: DC | PRN

## 2020-12-23 MED ORDER — CLONIDINE HCL 0.1 MG PO TABS: 0.1000 mg | ORAL_TABLET | Freq: Two times a day (BID) | ORAL | 1 refills | Status: DC

## 2020-12-23 MED ORDER — METRONIDAZOLE 500 MG PO TABS: 500.0000 mg | ORAL_TABLET | Freq: Three times a day (TID) | ORAL | 0 refills | Status: AC

## 2020-12-23 NOTE — Assessment & Plan Note (Signed)
Patient currently undergoing detox at [facility] we will continue current plan of care

## 2020-12-23 NOTE — Progress Notes (Signed)
Patient has eaten today and used nicotine patch Patient denies pain at this time. Patient reports trouble sleeping.

## 2020-12-23 NOTE — Assessment & Plan Note (Signed)
As per anxiety assessment 
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
## 2020-12-23 NOTE — Assessment & Plan Note (Signed)
Due to vaginal discharge we will obtain cervical vaginal cytology and give empiric Flagyl for 5 days  Referral to gynecology made for amenorrhea

## 2020-12-23 NOTE — Assessment & Plan Note (Signed)
History of PTSD recurrent depression generalized anxiety for this we will prescribe Lexapro 10 mg daily

## 2020-12-23 NOTE — Assessment & Plan Note (Signed)
Hypertension exacerbated by withdrawal from heroin and cocaine  Will begin clonidine 0.1 mg twice daily and check metabolic profile and blood count

## 2020-12-23 NOTE — Patient Instructions (Signed)
Begin Flagyl 1 3 times daily for 5 days  Urine studies will be sent to evaluate for vaginal or urinary tract infections  Complete set of labs obtained  Begin Lexapro 1 daily  Begin clonidine 1 twice daily for blood pressure  Take melatonin 10 mg 1 to 2 hours before bedtime to help sleep  An appoint with Dr. Delford Field in 1 month at [facility] will be made and a referral to gynecology will be made at the [facility]

## 2020-12-24 LAB — CBC WITH DIFFERENTIAL/PLATELET
Basophils Absolute: 0 10*3/uL (ref 0.0–0.2)
Basos: 0 %
EOS (ABSOLUTE): 0.1 10*3/uL (ref 0.0–0.4)
Eos: 1 %
Hematocrit: 46 % (ref 34.0–46.6)
Hemoglobin: 15.6 g/dL (ref 11.1–15.9)
Immature Grans (Abs): 0 10*3/uL (ref 0.0–0.1)
Immature Granulocytes: 0 %
Lymphocytes Absolute: 2.3 10*3/uL (ref 0.7–3.1)
Lymphs: 25 %
MCH: 29.1 pg (ref 26.6–33.0)
MCHC: 33.9 g/dL (ref 31.5–35.7)
MCV: 86 fL (ref 79–97)
Monocytes Absolute: 0.6 10*3/uL (ref 0.1–0.9)
Monocytes: 6 %
Neutrophils Absolute: 6.2 10*3/uL (ref 1.4–7.0)
Neutrophils: 68 %
Platelets: 249 10*3/uL (ref 150–450)
RBC: 5.36 x10E6/uL — ABNORMAL HIGH (ref 3.77–5.28)
RDW: 13 % (ref 11.7–15.4)
WBC: 9.3 10*3/uL (ref 3.4–10.8)

## 2020-12-24 LAB — COMPREHENSIVE METABOLIC PANEL
ALT: 28 IU/L (ref 0–32)
AST: 14 IU/L (ref 0–40)
Albumin/Globulin Ratio: 1.8 (ref 1.2–2.2)
Albumin: 4.6 g/dL (ref 3.9–5.0)
Alkaline Phosphatase: 77 IU/L (ref 44–121)
BUN/Creatinine Ratio: 24 — ABNORMAL HIGH (ref 9–23)
BUN: 15 mg/dL (ref 6–20)
Bilirubin Total: 0.4 mg/dL (ref 0.0–1.2)
CO2: 21 mmol/L (ref 20–29)
Calcium: 9.4 mg/dL (ref 8.7–10.2)
Chloride: 102 mmol/L (ref 96–106)
Creatinine, Ser: 0.63 mg/dL (ref 0.57–1.00)
Globulin, Total: 2.6 g/dL (ref 1.5–4.5)
Glucose: 99 mg/dL (ref 65–99)
Potassium: 4.5 mmol/L (ref 3.5–5.2)
Sodium: 141 mmol/L (ref 134–144)
Total Protein: 7.2 g/dL (ref 6.0–8.5)
eGFR: 124 mL/min/{1.73_m2} (ref >59)

## 2020-12-26 ENCOUNTER — Telehealth: Payer: Self-pay | Admitting: Internal Medicine

## 2020-12-26 LAB — URINE CULTURE

## 2020-12-26 MED ORDER — CLONIDINE HCL 0.2 MG PO TABS: 0.2000 mg | ORAL_TABLET | Freq: Two times a day (BID) | ORAL | 1 refills | Status: DC

## 2020-12-26 NOTE — Telephone Encounter (Signed)
Received call from the on-call nurse stating that patient is at [facility] and the RN there would like for the on-call doctor to call about patient's blood pressure.  I called [facility] and spoke with RN Bjorn Loser.  She states that the patient was recently started on clonidine 0.1 mg twice a day.  Her blood pressure has been running high.  Recent blood pressure was 188/113 with pulse of 100.  I told her to increase the clonidine 0.2 mg twice a day.

## 2020-12-28 LAB — CYTOLOGY, URINE

## 2020-12-30 ENCOUNTER — Telehealth: Payer: Self-pay | Admitting: *Deleted

## 2020-12-30 NOTE — Telephone Encounter (Signed)
Medical Assistant left message on patient's home and cell voicemail. Voicemail states to give a call back to Shaunak Kreis with [facility] at [phone]. 

## 2020-12-30 NOTE — Telephone Encounter (Signed)
-----   Message from Storm Frisk, MD sent at 12/27/2020  4:47 PM EDT ----- Let pt know urine culture NEG

## 2021-01-21 ENCOUNTER — Other Ambulatory Visit: Payer: Self-pay

## 2021-01-21 ENCOUNTER — Telehealth (HOSPITAL_COMMUNITY): Payer: No Payment, Other | Admitting: Physician Assistant

## 2021-01-26 ENCOUNTER — Encounter: Payer: Self-pay | Admitting: Family Medicine

## 2021-02-02 ENCOUNTER — Ambulatory Visit: Payer: Self-pay | Admitting: Family Medicine

## 2021-02-11 ENCOUNTER — Telehealth: Payer: Self-pay | Admitting: Family Medicine

## 2021-02-11 NOTE — Telephone Encounter (Signed)
Called patient with no answer and was unable to leave a message. There was a change in the office the 6-13 appointment had to be changed to 6-14 @3 :15

## 2021-02-15 ENCOUNTER — Encounter: Payer: Self-pay | Admitting: Obstetrics and Gynecology

## 2021-02-26 ENCOUNTER — Other Ambulatory Visit: Payer: Self-pay

## 2021-02-26 ENCOUNTER — Ambulatory Visit (INDEPENDENT_AMBULATORY_CARE_PROVIDER_SITE_OTHER): Payer: No Payment, Other | Admitting: Physician Assistant

## 2021-02-26 DIAGNOSIS — F4310 Post-traumatic stress disorder, unspecified: Secondary | ICD-10-CM | POA: Diagnosis not present

## 2021-02-26 DIAGNOSIS — F5105 Insomnia due to other mental disorder: Secondary | ICD-10-CM | POA: Diagnosis not present

## 2021-02-26 DIAGNOSIS — F331 Major depressive disorder, recurrent, moderate: Secondary | ICD-10-CM

## 2021-02-26 DIAGNOSIS — F411 Generalized anxiety disorder: Secondary | ICD-10-CM

## 2021-02-26 DIAGNOSIS — F99 Mental disorder, not otherwise specified: Secondary | ICD-10-CM
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
## 2021-02-27 MED ORDER — CLONIDINE HCL 0.2 MG PO TABS: 0.2000 mg | ORAL_TABLET | Freq: Two times a day (BID) | ORAL | 1 refills | Status: DC

## 2021-02-27 MED ORDER — MELATONIN 5 MG PO CAPS: 10.0000 mg | ORAL_CAPSULE | Freq: Every evening | ORAL | 1 refills | Status: DC | PRN

## 2021-02-27 MED ORDER — ESCITALOPRAM OXALATE 20 MG PO TABS: 20.0000 mg | ORAL_TABLET | Freq: Every day | ORAL | 1 refills | Status: DC

## 2021-02-27 MED ORDER — BUSPIRONE HCL 7.5 MG PO TABS
7.5000 mg | ORAL_TABLET | Freq: Every day | ORAL | 1 refills | Status: DC
Start: 2021-02-27 — End: 2021-04-16

## 2021-03-01 ENCOUNTER — Encounter: Payer: Self-pay | Admitting: Family Medicine

## 2021-03-02 ENCOUNTER — Encounter (HOSPITAL_COMMUNITY): Payer: Self-pay | Admitting: Physician Assistant

## 2021-04-16 ENCOUNTER — Telehealth (INDEPENDENT_AMBULATORY_CARE_PROVIDER_SITE_OTHER): Payer: No Payment, Other | Admitting: Physician Assistant

## 2021-04-16 DIAGNOSIS — F331 Major depressive disorder, recurrent, moderate: Secondary | ICD-10-CM

## 2021-04-16 DIAGNOSIS — F5105 Insomnia due to other mental disorder: Secondary | ICD-10-CM

## 2021-04-16 DIAGNOSIS — F4310 Post-traumatic stress disorder, unspecified: Secondary | ICD-10-CM

## 2021-04-16 DIAGNOSIS — F411 Generalized anxiety disorder: Secondary | ICD-10-CM

## 2021-04-16 DIAGNOSIS — F99 Mental disorder, not otherwise specified: Secondary | ICD-10-CM

## 2021-04-16 MED ORDER — MELATONIN 5 MG PO CAPS: 10.0000 mg | ORAL_CAPSULE | Freq: Every evening | ORAL | 1 refills | Status: DC | PRN

## 2021-04-16 MED ORDER — ESCITALOPRAM OXALATE 20 MG PO TABS: 20.0000 mg | ORAL_TABLET | Freq: Every day | ORAL | 1 refills | Status: DC

## 2021-04-16 MED ORDER — CLONIDINE HCL 0.2 MG PO TABS: 0.2000 mg | ORAL_TABLET | Freq: Two times a day (BID) | ORAL | 1 refills | Status: DC

## 2021-04-16 MED ORDER — BUSPIRONE HCL 7.5 MG PO TABS: 7.5000 mg | ORAL_TABLET | Freq: Every day | ORAL | 1 refills | Status: DC

## 2021-04-16 NOTE — Progress Notes (Signed)
BH MD/PA/NP OP Progress Note  Virtual Visit via Telephone Note  I connected with Nichole Curry on 04/16/21 at  4:30 PM EDT by telephone and verified that I am speaking with the correct person using two identifiers.  Location: Patient: Home Provider: Clinic   I discussed the limitations, risks, security and privacy concerns of performing an evaluation and management service by telephone and the availability of in person appointments. I also discussed with the patient that there may be a patient responsible charge related to this service. The patient expressed understanding and agreed to proceed.  Follow Up Instructions:  I discussed the assessment and treatment plan with the patient. The patient was provided an opportunity to ask questions and all were answered. The patient agreed with the plan and demonstrated an understanding of the instructions.   The patient was advised to call back or seek an in-person evaluation if the symptoms worsen or if the condition fails to improve as anticipated.  I provided 25 minutes of non-face-to-face time during this encounter.  Meta Hatchet, PA    04/16/2021 4:48 PM Nichole Curry  MRN:  [id number]  Chief Complaint: Follow up and medication management  HPI:     Visit Diagnosis:    ICD-10-CM   1. Generalized anxiety disorder  F41.1 cloNIDine (CATAPRES) 0.2 MG tablet    escitalopram (LEXAPRO) 20 MG tablet    busPIRone (BUSPAR) 7.5 MG tablet    2. Insomnia due to other mental disorder  F51.05 Melatonin 5 MG CAPS   F99     3. PTSD (post-traumatic stress disorder)  F43.10 escitalopram (LEXAPRO) 20 MG tablet    4. Moderate episode of recurrent major depressive disorder (HCC)  F33.1 escitalopram (LEXAPRO) 20 MG tablet    busPIRone (BUSPAR) 7.5 MG tablet      Past Psychiatric History:  Generalized anxiety disorder Insomnia PTSD Major depressive disorder  Past Medical History:  Past Medical History:  Diagnosis Date   Anxiety     Drug use    Gallstones    Hypertension     Past Surgical History:  Procedure Laterality Date   CESAREAN SECTION      Family Psychiatric History:  Grandmother - Depression, anxiety, and nerve problems  Family History: History reviewed. No pertinent family history.  Social History:  Social History   Socioeconomic History   Marital status: Single    Spouse name: Not on file   Number of children: Not on file   Years of education: Not on file   Highest education level: Not on file  Occupational History   Not on file  Tobacco Use   Smoking status: Some Days    Packs/day: 0.50    Types: Cigarettes   Smokeless tobacco: Never  Vaping Use   Vaping Use: Never used  Substance and Sexual Activity   Alcohol use: Not Currently   Drug use: Not Currently    Types: IV    Comment: heroin, crack   Sexual activity: Not on file  Other Topics Concern   Not on file  Social History Narrative   Not on file   Social Determinants of Health   Financial Resource Strain: Not on file  Food Insecurity: Not on file  Transportation Needs: Not on file  Physical Activity: Not on file  Stress: Not on file  Social Connections: Not on file    Allergies:  Allergies  Allergen Reactions   Black Walnut Pollen Allergy Skin Test Anaphylaxis and Hives   Pecan Extract Allergy Skin  Test Anaphylaxis and Hives   Other Other (See Comments)    Itchy watery eyes, stuffy nose, hard to breathe    Pollen Extract Other (See Comments)    Itchy watery eyes, stuffy nose, hard to breathe Itchy watery eyes, stuffy nose, hard to breathe     Metabolic Disorder Labs: No results found for: HGBA1C, MPG No results found for: PROLACTIN No results found for: CHOL, TRIG, HDL, CHOLHDL, VLDL, LDLCALC No results found for: TSH  Therapeutic Level Labs: No results found for: LITHIUM No results found for: VALPROATE No components found for:  CBMZ  Current Medications: Current Outpatient Medications  Medication Sig  Dispense Refill   busPIRone (BUSPAR) 7.5 MG tablet Take 1 tablet (7.5 mg total) by mouth daily. 30 tablet 1   cloNIDine (CATAPRES) 0.2 MG tablet Take 1 tablet (0.2 mg total) by mouth 2 (two) times daily. 60 tablet 1   EPINEPHrine 0.3 mg/0.3 mL IJ SOAJ injection Inject 0.3 mg into the muscle as needed for anaphylaxis. 1 each 0   escitalopram (LEXAPRO) 20 MG tablet Take 1 tablet (20 mg total) by mouth daily. 30 tablet 1   Melatonin 5 MG CAPS Take 2 capsules (10 mg total) by mouth at bedtime as needed. 60 capsule 1   No current facility-administered medications for this visit.     Musculoskeletal: Strength & Muscle Tone: Unable to assess due to telemedicine visit Gait & Station: Unable to assess due to telemedicine visit Patient leans: Unable to assess due to telemedicine visit  Psychiatric Specialty Exam: Review of Systems  Psychiatric/Behavioral:  Positive for decreased concentration, dysphoric mood and sleep disturbance. Negative for hallucinations, self-injury and suicidal ideas. The patient is nervous/anxious. The patient is not hyperactive.    There were no vitals taken for this visit.There is no height or weight on file to calculate BMI.  General Appearance: Unable to assess due to telemedicine visit  Eye Contact:  Unable to assess due to telemedicine visit  Speech:  Clear and Coherent and Normal Rate  Volume:  Normal  Mood:  Anxious, Depressed, and Dysphoric  Affect:  Congruent and Depressed  Thought Process:  Coherent, Goal Directed, and Descriptions of Associations: Intact  Orientation:  Full (Time, Place, and Person)  Thought Content: WDL   Suicidal Thoughts:  No  Homicidal Thoughts:  No  Memory:  Immediate;   Good Recent;   Good Remote;   Good  Judgement:  Good  Insight:  Fair  Psychomotor Activity:  Normal  Concentration:  Concentration: Good and Attention Span: Good  Recall:  Good  Fund of Knowledge: Good  Language: Good  Akathisia:  NA  Handed:  Right  AIMS (if  indicated): not done  Assets:  Communication Skills Desire for Improvement Housing Social Support  ADL's:  Intact  Cognition: WNL  Sleep:  Fair   Screenings: GAD-7    Flowsheet Row Video Visit from 04/16/2021 in [facility] Office Visit from 02/26/2021 in [facility] Office Visit from 12/23/2020 in [place] CLINIC 1  Total GAD-7 Score 10 20 21       PHQ2-9    Flowsheet Row Video Visit from 04/16/2021 in [facility] Office Visit from 02/26/2021 in [facility] Office Visit from 12/23/2020 in [place] CLINIC 1  PHQ-2 Total Score 3 5 6   PHQ-9 Total Score 14 23 25       Flowsheet Row Video Visit from 04/16/2021 in [facility]  Office Visit from 02/26/2021 in [facility] ED from 12/20/2020 in [facility] EMERGENCY DEPARTMENT  C-SSRS RISK CATEGORY No Risk No Risk No Risk        Assessment and Plan:     1. Generalized anxiety disorder  - cloNIDine (CATAPRES) 0.2 MG tablet; Take 1 tablet (0.2 mg total) by mouth 2 (two) times daily.  Dispense: 60 tablet; Refill: 1 - escitalopram (LEXAPRO) 20 MG tablet; Take 1 tablet (20 mg total) by mouth daily.  Dispense: 30 tablet; Refill: 1 - busPIRone (BUSPAR) 7.5 MG tablet; Take 1 tablet (7.5 mg total) by mouth daily.  Dispense: 30 tablet; Refill: 1  2. Insomnia due to other mental disorder  - Melatonin 5 MG CAPS; Take 2 capsules (10 mg total) by mouth at bedtime as needed.  Dispense: 60 capsule; Refill: 1  3. PTSD (post-traumatic stress disorder)  - escitalopram (LEXAPRO) 20 MG tablet; Take 1 tablet (20 mg total) by mouth daily.  Dispense: 30 tablet; Refill: 1  4. Moderate episode of recurrent major depressive disorder (HCC)  - escitalopram (LEXAPRO) 20 MG tablet; Take 1 tablet (20 mg total) by mouth daily.  Dispense: 30 tablet; Refill: 1 - busPIRone (BUSPAR)  7.5 MG tablet; Take 1 tablet (7.5 mg total) by mouth daily.  Dispense: 30 tablet; Refill: 1  Patient to follow up in 2 months Provider spent a total of 25 minutes with the patient/reviewing patient's chart  Meta Hatchet, PA 04/16/2021, 4:48 PM

## 2021-04-19 ENCOUNTER — Encounter (HOSPITAL_COMMUNITY): Payer: Self-pay | Admitting: Physician Assistant

## 2021-06-18 ENCOUNTER — Encounter (HOSPITAL_COMMUNITY): Payer: Self-pay | Admitting: Physician Assistant

## 2021-06-18 ENCOUNTER — Telehealth (INDEPENDENT_AMBULATORY_CARE_PROVIDER_SITE_OTHER): Payer: No Payment, Other | Admitting: Physician Assistant

## 2021-06-18 DIAGNOSIS — F4310 Post-traumatic stress disorder, unspecified: Secondary | ICD-10-CM

## 2021-06-18 DIAGNOSIS — F331 Major depressive disorder, recurrent, moderate: Secondary | ICD-10-CM

## 2021-06-18 DIAGNOSIS — F5105 Insomnia due to other mental disorder: Secondary | ICD-10-CM

## 2021-06-18 DIAGNOSIS — F411 Generalized anxiety disorder: Secondary | ICD-10-CM | POA: Diagnosis not present

## 2021-06-18 DIAGNOSIS — F99 Mental disorder, not otherwise specified: Secondary | ICD-10-CM

## 2021-06-18 MED ORDER — BUSPIRONE HCL 7.5 MG PO TABS: 7.5000 mg | ORAL_TABLET | Freq: Every day | ORAL | 1 refills | Status: AC

## 2021-06-18 MED ORDER — CLONIDINE HCL 0.2 MG PO TABS: 0.2000 mg | ORAL_TABLET | Freq: Two times a day (BID) | ORAL | 1 refills | Status: DC

## 2021-06-18 MED ORDER — MELATONIN 5 MG PO CAPS: 10.0000 mg | ORAL_CAPSULE | Freq: Every evening | ORAL | 1 refills | Status: AC | PRN

## 2021-06-18 MED ORDER — ESCITALOPRAM OXALATE 20 MG PO TABS: 20.0000 mg | ORAL_TABLET | Freq: Every day | ORAL | 1 refills | Status: DC

## 2021-06-18 NOTE — Progress Notes (Addendum)
BH MD/PA/NP OP Progress Note  Virtual Visit via Telephone Note  I connected with Nichole Curry on 06/18/21 at  4:30 PM EDT by telephone and verified that I am speaking with the correct person using two identifiers.  Location: Patient: Home Provider: Clinic   I discussed the limitations, risks, security and privacy concerns of performing an evaluation and management service by telephone and the availability of in person appointments. I also discussed with the patient that there may be a patient responsible charge related to this service. The patient expressed understanding and agreed to proceed.  Follow Up Instructions:   I discussed the assessment and treatment plan with the patient. The patient was provided an opportunity to ask questions and all were answered. The patient agreed with the plan and demonstrated an understanding of the instructions.   The patient was advised to call back or seek an in-person evaluation if the symptoms worsen or if the condition fails to improve as anticipated.  I provided 10 minutes of non-face-to-face time during this encounter.  Meta Hatchet, PA   06/18/2021 4:36 PM Alanys Godino  MRN:  [id number]  Chief Complaint: Follow up and medication management  HPI:   Nichole Curry is a 30 year old female with a past psychiatric history significant for generalized anxiety disorder, insomnia, PTSD, and major depressive disorder who presents to [facility] via virtual telephone visit for follow-up and medication management.  Patient is currently being managed on the following medications:  Clonidine 0.2 mg 3 times daily Escitalopram 20 mg daily Buspirone 7.5 mg daily Melatonin 10 mg at bedtime  Patient reports no issues or concerns regarding her current medication regimen.  Patient denies the need for dosage adjustments at this time and is requesting refills on all of her medications following the conclusion  of the encounter.  Patient denies experiencing any major depressive symptoms at this time.  Patient endorses that she has been experiencing some anxiety but it has not been on a regular basis.  Patient reports that her anxiety is an 8 out of 10 at its most severe.  Patient denied any new stressors but does endorse that she has acquired a new job at an [employer] and has been enjoying it thus far.  A PHQ-9 screen was performed with the patient scoring an 8.  A GAD-7 screen was also performed with the patient scoring a 7.  Patient is alert and oriented x4, pleasant, calm, cooperative, and fully engaged in conversation during the encounter.  Patient endorses being pretty upbeat and happy.  Patient denies suicidal or homicidal ideations.  She further denies auditory or visual hallucinations and does not appear to be responding to internal/external stimuli.  Patient endorses good sleep and receives on average 7 hours of sleep each night.  Patient endorses good appetite and eats on average 2 meals per day.  Patient denies alcohol consumption and illicit drug use.  Patient endorses tobacco use and smokes on average 1/2 pack/day.  Visit Diagnosis:    ICD-10-CM   1. Generalized anxiety disorder  F41.1 cloNIDine (CATAPRES) 0.2 MG tablet    escitalopram (LEXAPRO) 20 MG tablet    busPIRone (BUSPAR) 7.5 MG tablet    2. Insomnia due to other mental disorder  F51.05 Melatonin 5 MG CAPS   F99     3. PTSD (post-traumatic stress disorder)  F43.10 escitalopram (LEXAPRO) 20 MG tablet    4. Moderate episode of recurrent major depressive disorder (HCC)  F33.1 escitalopram (LEXAPRO) 20  MG tablet    busPIRone (BUSPAR) 7.5 MG tablet      Past Psychiatric History:  Generalized anxiety disorder Insomnia PTSD Major depressive disorder  Past Medical History:  Past Medical History:  Diagnosis Date   Anxiety    Drug use    Gallstones    Hypertension     Past Surgical History:  Procedure Laterality Date   CESAREAN  SECTION      Family Psychiatric History:  Grandmother - Depression, anxiety, and nerve problems  Family History: History reviewed. No pertinent family history.  Social History:  Social History   Socioeconomic History   Marital status: Single    Spouse name: Not on file   Number of children: Not on file   Years of education: Not on file   Highest education level: Not on file  Occupational History   Not on file  Tobacco Use   Smoking status: Some Days    Packs/day: 0.50    Types: Cigarettes   Smokeless tobacco: Never  Vaping Use   Vaping Use: Never used  Substance and Sexual Activity   Alcohol use: Not Currently   Drug use: Not Currently    Types: IV    Comment: heroin, crack   Sexual activity: Not on file  Other Topics Concern   Not on file  Social History Narrative   Not on file   Social Determinants of Health   Financial Resource Strain: Not on file  Food Insecurity: Not on file  Transportation Needs: Not on file  Physical Activity: Not on file  Stress: Not on file  Social Connections: Not on file    Allergies:  Allergies  Allergen Reactions   Black Walnut Pollen Allergy Skin Test Anaphylaxis and Hives   Pecan Extract Allergy Skin Test Anaphylaxis and Hives   Other Other (See Comments)    Itchy watery eyes, stuffy nose, hard to breathe    Pollen Extract Other (See Comments)    Itchy watery eyes, stuffy nose, hard to breathe Itchy watery eyes, stuffy nose, hard to breathe     Metabolic Disorder Labs: No results found for: HGBA1C, MPG No results found for: PROLACTIN No results found for: CHOL, TRIG, HDL, CHOLHDL, VLDL, LDLCALC No results found for: TSH  Therapeutic Level Labs: No results found for: LITHIUM No results found for: VALPROATE No components found for:  CBMZ  Current Medications: Current Outpatient Medications  Medication Sig Dispense Refill   busPIRone (BUSPAR) 7.5 MG tablet Take 1 tablet (7.5 mg total) by mouth daily. 30 tablet 1    cloNIDine (CATAPRES) 0.2 MG tablet Take 1 tablet (0.2 mg total) by mouth 2 (two) times daily. 60 tablet 1   EPINEPHrine 0.3 mg/0.3 mL IJ SOAJ injection Inject 0.3 mg into the muscle as needed for anaphylaxis. 1 each 0   escitalopram (LEXAPRO) 20 MG tablet Take 1 tablet (20 mg total) by mouth daily. 30 tablet 1   Melatonin 5 MG CAPS Take 2 capsules (10 mg total) by mouth at bedtime as needed. 60 capsule 1   No current facility-administered medications for this visit.     Musculoskeletal: Strength & Muscle Tone: Unable to assess due to telemedicine visit Gait & Station: Unable to assess due to telemedicine visit Patient leans: Unable to assess due to telemedicine visit  Psychiatric Specialty Exam: Review of Systems  Psychiatric/Behavioral:  Negative for decreased concentration, dysphoric mood, hallucinations, self-injury, sleep disturbance and suicidal ideas. The patient is nervous/anxious. The patient is not hyperactive.  There were no vitals taken for this visit.There is no height or weight on file to calculate BMI.  General Appearance: Unable to assess due to telemedicine visit  Eye Contact:  Unable to assess due to telemedicine visit  Speech:  Clear and Coherent  Volume:  Normal  Mood:  Euthymic  Affect:  Appropriate  Thought Process:  Coherent, Goal Directed, and Descriptions of Associations: Intact  Orientation:  Full (Time, Place, and Person)  Thought Content: WDL   Suicidal Thoughts:  No  Homicidal Thoughts:  No  Memory:  Immediate;   Good Recent;   Good Remote;   Good  Judgement:  Good  Insight:  Good  Psychomotor Activity:  Normal  Concentration:  Concentration: Good and Attention Span: Good  Recall:  Good  Fund of Knowledge: Good  Language: Good  Akathisia:  NA  Handed:  Right  AIMS (if indicated): not done  Assets:  Communication Skills  ADL's:  Intact  Cognition: WNL  Sleep:  Good   Screenings: GAD-7    Flowsheet Row Video Visit from 06/18/2021 in  [facility] Video Visit from 04/16/2021 in [facility] Office Visit from 02/26/2021 in [facility] Office Visit from 12/23/2020 in [facility]  Total GAD-7 Score 7 10 20 21       PHQ2-9    Flowsheet Row Video Visit from 06/18/2021 in [facility] Video Visit from 04/16/2021 in [facility] Office Visit from 02/26/2021 in [facility] Office Visit from 12/23/2020 in [facility] 1  PHQ-2 Total Score 2 3 5 6   PHQ-9 Total Score 8 14 23 25       Flowsheet Row Video Visit from 06/18/2021 in [facility] Video Visit from 04/16/2021 in [facility] Office Visit from 02/26/2021 in [facility]  C-SSRS RISK CATEGORY No Risk No Risk No Risk        Assessment and Plan:   Nichole Curry is a 30 year old female with a past psychiatric history significant for generalized anxiety disorder, insomnia, PTSD, and major depressive disorder who presents to [facility] via virtual telephone visit for follow-up and medication management.  Patient reports no issues or concerns regarding her current medications at this time.  Patient denies the need for dosage adjustments at this time and is requesting refills on all her medications following the conclusion of the encounter.  Patient's medications to be e-prescribed to pharmacy of choice.  1. Generalized anxiety disorder  - cloNIDine (CATAPRES) 0.2 MG tablet; Take 1 tablet (0.2 mg total) by mouth 2 (two) times daily.  Dispense: 60 tablet; Refill: 1 - escitalopram (LEXAPRO) 20 MG tablet; Take 1 tablet (20 mg total) by mouth daily.  Dispense: 30 tablet; Refill: 1 - busPIRone (BUSPAR) 7.5 MG tablet; Take 1 tablet (7.5 mg total) by mouth daily.  Dispense: 30 tablet;  Refill: 1  2. Insomnia due to other mental disorder  - Melatonin 5 MG CAPS; Take 2 capsules (10 mg total) by mouth at bedtime as needed.  Dispense: 60 capsule; Refill: 1  3. PTSD (post-traumatic stress disorder)  - escitalopram (LEXAPRO) 20 MG tablet; Take 1 tablet (20 mg total) by mouth daily.  Dispense: 30 tablet; Refill: 1  4. Moderate episode of recurrent major depressive disorder (HCC)  - escitalopram (LEXAPRO) 20 MG tablet; Take 1 tablet (20 mg total) by mouth  daily.  Dispense: 30 tablet; Refill: 1 - busPIRone (BUSPAR) 7.5 MG tablet; Take 1 tablet (7.5 mg total) by mouth daily.  Dispense: 30 tablet; Refill: 1  Patient to follow up in 2 months Provider spent a total of 10 minutes with the patient/reviewing patient's chart  Meta Hatchet, PA 06/18/2021, 4:36 PM

## 2021-07-02 ENCOUNTER — Other Ambulatory Visit (HOSPITAL_COMMUNITY): Payer: Self-pay | Admitting: Physician Assistant

## 2021-07-02 DIAGNOSIS — F411 Generalized anxiety disorder: Secondary | ICD-10-CM

## 2021-07-02 DIAGNOSIS — F331 Major depressive disorder, recurrent, moderate: Secondary | ICD-10-CM

## 2021-07-04 ENCOUNTER — Other Ambulatory Visit (HOSPITAL_COMMUNITY): Payer: Self-pay | Admitting: Physician Assistant

## 2021-07-04 DIAGNOSIS — F4310 Post-traumatic stress disorder, unspecified: Secondary | ICD-10-CM

## 2021-07-04 DIAGNOSIS — F411 Generalized anxiety disorder: Secondary | ICD-10-CM

## 2021-07-04 DIAGNOSIS — F331 Major depressive disorder, recurrent, moderate: Secondary | ICD-10-CM

## 2021-07-05 NOTE — Telephone Encounter (Signed)
Patient called stated she needs refill on her med's. After checking with both Rx's she was told she has no refills

## 2021-07-06 ENCOUNTER — Telehealth (HOSPITAL_COMMUNITY): Payer: Self-pay | Admitting: *Deleted

## 2021-07-06 NOTE — Telephone Encounter (Signed)
Call from patient stating she doesn't have meds or refills available and she called the pharmacy before calling me. I called the pharmacy, she has two profiles and once tech overlooked everything she does have meds available. Called her back to inform her she needs to call pharmacy back and request they fill what it is she needs.

## 2021-07-06 NOTE — Telephone Encounter (Signed)
Per patient's MAR, patient's medication's were last e-prescribed on 06/18/2021. Provider to resend medications.

## 2023-04-03 ENCOUNTER — Other Ambulatory Visit (HOSPITAL_BASED_OUTPATIENT_CLINIC_OR_DEPARTMENT_OTHER): Payer: Self-pay

## 2023-04-03 MED ORDER — WEGOVY 0.25 MG/0.5ML ~~LOC~~ SOAJ
0.5000 mL | SUBCUTANEOUS | 0 refills | Status: AC
  Filled 2023-04-03: qty 2, 28d supply, fill #0

## 2023-04-04 ENCOUNTER — Other Ambulatory Visit (HOSPITAL_BASED_OUTPATIENT_CLINIC_OR_DEPARTMENT_OTHER): Payer: Self-pay

## 2023-04-05 ENCOUNTER — Other Ambulatory Visit (HOSPITAL_BASED_OUTPATIENT_CLINIC_OR_DEPARTMENT_OTHER): Payer: Self-pay

## 2023-05-01 ENCOUNTER — Other Ambulatory Visit (HOSPITAL_BASED_OUTPATIENT_CLINIC_OR_DEPARTMENT_OTHER): Payer: Self-pay

## 2023-05-01 MED ORDER — WEGOVY 0.5 MG/0.5ML ~~LOC~~ SOAJ
0.5000 mg | SUBCUTANEOUS | 0 refills | Status: DC
  Filled 2023-05-01: qty 2, 28d supply, fill #0

## 2023-05-08 ENCOUNTER — Encounter (HOSPITAL_BASED_OUTPATIENT_CLINIC_OR_DEPARTMENT_OTHER): Payer: Self-pay | Admitting: Emergency Medicine

## 2023-05-08 ENCOUNTER — Emergency Department (HOSPITAL_BASED_OUTPATIENT_CLINIC_OR_DEPARTMENT_OTHER)
Admission: EM | Admit: 2023-05-08 | Discharge: 2023-05-08 | Disposition: A | Discharge status: Home / Self Care | Payer: Self-pay | Attending: Emergency Medicine | Admitting: Emergency Medicine

## 2023-05-08 ENCOUNTER — Other Ambulatory Visit: Payer: Self-pay

## 2023-05-08 DIAGNOSIS — R197 Diarrhea, unspecified: Secondary | ICD-10-CM | POA: Insufficient documentation

## 2023-05-08 DIAGNOSIS — I10 Essential (primary) hypertension: Secondary | ICD-10-CM | POA: Insufficient documentation

## 2023-05-08 MED ORDER — DICYCLOMINE HCL 10 MG PO CAPS
10.0000 mg | ORAL_CAPSULE | Freq: Once | ORAL | Status: AC
  Administered 2023-05-08: 10 mg via ORAL
  Filled 2023-05-08: qty 1

## 2023-05-08 MED ORDER — DICYCLOMINE HCL 20 MG PO TABS: 20.0000 mg | ORAL_TABLET | Freq: Two times a day (BID) | ORAL | 0 refills | Status: AC | PRN

## 2023-05-08 MED ORDER — ALUM & MAG HYDROXIDE-SIMETH 200-200-20 MG/5ML PO SUSP
30.0000 mL | Freq: Once | ORAL | Status: AC
  Administered 2023-05-08: 30 mL via ORAL
  Filled 2023-05-08: qty 30

## 2023-05-08 MED ORDER — ONDANSETRON 4 MG PO TBDP
8.0000 mg | ORAL_TABLET | Freq: Once | ORAL | Status: AC
  Administered 2023-05-08: 8 mg via ORAL
  Filled 2023-05-08: qty 2

## 2023-05-08 MED ORDER — ALUM & MAG HYDROXIDE-SIMETH 400-400-40 MG/5ML PO SUSP: 15.0000 mL | Freq: Four times a day (QID) | ORAL | 0 refills | Status: AC | PRN

## 2023-05-08 MED ORDER — ONDANSETRON 4 MG PO TBDP: ORAL_TABLET | ORAL | 0 refills | Status: AC

## 2023-05-08 NOTE — ED Provider Notes (Signed)
[place] [facility] Provider Note   CSN: [id number] Arrival date & time: 05/08/23  0446     History  Chief Complaint  Patient presents with   Diarrhea    Nichole Curry is a 32 y.o. female.  32 yo F with HTN and overwieght on wegovy here with a few days of watery diarrhea. At least 5 watery stools in last 24 hours. Did have an episode of emesis yesterday. No sick contacts, suspicious food intakes, recent travels, antibiotics or other high risk features. Hasn't tried anything for the symptoms as she didn't know what to take at home. Urinating normal. No dry mouth. Overall tolerating food/liquid well. Some abdominal cramping and gas relieved by episode of diarrhea. Has had some burping more than normal since increasing her dose of wegovy recently.    Diarrhea      Home Medications Prior to Admission medications   Medication Sig Start Date End Date Taking? Authorizing Provider  alum & mag hydroxide-simeth (MAALOX PLUS) 400-400-40 MG/5ML suspension Take 15 mLs by mouth every 6 (six) hours as needed for indigestion. 05/08/23  Yes Geneve Kimpel, Barbara Cower, MD  dicyclomine (BENTYL) 20 MG tablet Take 1 tablet (20 mg total) by mouth 2 (two) times daily as needed for spasms (abdominal cramping). 05/08/23  Yes Lehman Whiteley, Barbara Cower, MD  ondansetron (ZOFRAN-ODT) 4 MG disintegrating tablet 4mg  ODT q4 hours prn nausea/vomit 05/08/23  Yes Aneisa Karren, Barbara Cower, MD  cloNIDine (CATAPRES) 0.2 MG tablet TAKE 1 TABLET(0.2 MG) BY MOUTH TWICE DAILY 07/06/21   Nwoko, Stephens Shire E, PA  EPINEPHrine 0.3 mg/0.3 mL IJ SOAJ injection Inject 0.3 mg into the muscle as needed for anaphylaxis. 12/20/20   Long, Arlyss Repress, MD  escitalopram (LEXAPRO) 20 MG tablet TAKE 1 TABLET(20 MG) BY MOUTH DAILY 07/06/21   Nwoko, Tommas Olp, PA  Melatonin 5 MG CAPS Take 2 capsules (10 mg total) by mouth at bedtime as needed. 06/18/21   Nwoko, Tommas Olp, PA  Semaglutide-Weight Management (WEGOVY) 0.5 MG/0.5ML SOAJ Inject 0.5 mg into  the skin once a week. 05/01/23     WEGOVY 0.25 MG/0.5ML SOAJ Inject 0.25 mg into the skin once a week at 0900. 04/03/23         Allergies    Black walnut pollen allergy skin test, Pecan extract, Other, and Pollen extract    Review of Systems   Review of Systems  Gastrointestinal:  Positive for diarrhea.    Physical Exam Updated Vital Signs BP 130/76 (BP Location: Right Arm)   Pulse 72   Temp 97.6 F (36.4 C) (Oral)   Resp 20   Ht 5\' 1"  (1.549 m)   Wt 99.8 kg   LMP 04/25/2023   SpO2 98%   BMI 41.57 kg/m  Physical Exam Vitals and nursing note reviewed.  Constitutional:      Appearance: She is well-developed.  HENT:     Head: Normocephalic and atraumatic.  Cardiovascular:     Rate and Rhythm: Normal rate and regular rhythm.  Pulmonary:     Effort: No respiratory distress.     Breath sounds: No stridor.  Abdominal:     General: There is no distension.     Tenderness: There is no abdominal tenderness. There is no guarding.  Musculoskeletal:     Cervical back: Normal range of motion.  Skin:    General: Skin is warm and dry.  Neurological:     General: No focal deficit present.     Mental Status: She is alert.  ED Results / Procedures / Treatments   Labs (all labs ordered are listed, but only abnormal results are displayed) Labs Reviewed - No data to display  EKG None  Radiology No results found.  Procedures Procedures    Medications Ordered in ED Medications  dicyclomine (BENTYL) capsule 10 mg (10 mg Oral Given 05/08/23 0522)  ondansetron (ZOFRAN-ODT) disintegrating tablet 8 mg (8 mg Oral Given 05/08/23 0523)  alum & mag hydroxide-simeth (MAALOX/MYLANTA) 200-200-20 MG/5ML suspension 30 mL (30 mLs Oral Given 05/08/23 0522)    ED Course/ Medical Decision Making/ A&P                                 Medical Decision Making Risk OTC drugs. Prescription drug management.   VS WNL, no dry mouth, doubt dehydration requiring IV hydration. Discussed red  flags (none of which she has now) that would require antibiotics or stool testing. Discussed some treatments she could try at home but suggested against immodium 2/2 wegovy use. Follow up with PCP if not improving, here if worsening or red flag symptoms.   Final Clinical Impression(s) / ED Diagnoses Final diagnoses:  Diarrhea, unspecified type    Rx / DC Orders ED Discharge Orders          Ordered    alum & mag hydroxide-simeth (MAALOX PLUS) 400-400-40 MG/5ML suspension  Every 6 hours PRN        05/08/23 0519    dicyclomine (BENTYL) 20 MG tablet  2 times daily PRN        05/08/23 0519    ondansetron (ZOFRAN-ODT) 4 MG disintegrating tablet        05/08/23 0519              Salam Micucci, Barbara Cower, MD 05/08/23 [phone]

## 2023-05-08 NOTE — ED Triage Notes (Signed)
Pt reports diarrhea and abdominal cramps since Friday. Reports >5 stools in the last 24h. She has not taken anything for her sx at home. Denies sick contacts. Reports excessive gas, and one episode of vomiting that was yesterday. Ambulatory and in NAD. Afebrile.

## 2023-05-08 NOTE — Discharge Instructions (Signed)
The best thing you can do for your diarrhea in the immediate term is to use fiber like benefiber or metamucil to help firm up your stools. Probiotics are good for long term gut health and to help avoid diarrhea or constipation in the future.   I would avoid using immodium unless the diarrhea continues for a couple more days and then I would use it about half of what the directions say.   If you develop a fever, increased stools in a day or bloody stools return to the ED. If the diarrhea doesn't improve in 7-10 days then follow up with your pcp for further workup/management.

## 2023-06-07 ENCOUNTER — Other Ambulatory Visit (HOSPITAL_BASED_OUTPATIENT_CLINIC_OR_DEPARTMENT_OTHER): Payer: Self-pay

## 2023-06-07 MED ORDER — WEGOVY 1 MG/0.5ML ~~LOC~~ SOAJ
1.0000 mg | SUBCUTANEOUS | 0 refills | Status: DC
  Filled 2023-06-07: qty 2, 28d supply, fill #0

## 2023-07-05 ENCOUNTER — Other Ambulatory Visit (HOSPITAL_BASED_OUTPATIENT_CLINIC_OR_DEPARTMENT_OTHER): Payer: Self-pay

## 2023-07-05 MED ORDER — WEGOVY 1.7 MG/0.75ML ~~LOC~~ SOAJ
1.7000 mg | SUBCUTANEOUS | 0 refills | Status: DC
  Filled 2023-07-05: qty 3, 28d supply, fill #0

## 2023-07-27 ENCOUNTER — Other Ambulatory Visit (HOSPITAL_BASED_OUTPATIENT_CLINIC_OR_DEPARTMENT_OTHER): Payer: Self-pay

## 2023-07-27 MED ORDER — WEGOVY 1.7 MG/0.75ML ~~LOC~~ SOAJ
1.7000 mg | SUBCUTANEOUS | 0 refills | Status: DC
  Filled 2023-07-27: qty 3, 28d supply, fill #0

## 2023-08-24 ENCOUNTER — Other Ambulatory Visit (HOSPITAL_BASED_OUTPATIENT_CLINIC_OR_DEPARTMENT_OTHER): Payer: Self-pay

## 2023-08-24 MED ORDER — WEGOVY 1.7 MG/0.75ML ~~LOC~~ SOAJ
1.7000 mg | SUBCUTANEOUS | 1 refills | Status: DC
Start: 2023-08-24 — End: 2023-09-19
  Filled 2023-08-24: qty 3, 28d supply, fill #0

## 2023-08-25 ENCOUNTER — Other Ambulatory Visit (HOSPITAL_BASED_OUTPATIENT_CLINIC_OR_DEPARTMENT_OTHER): Payer: Self-pay

## 2023-08-28 ENCOUNTER — Other Ambulatory Visit (HOSPITAL_BASED_OUTPATIENT_CLINIC_OR_DEPARTMENT_OTHER): Payer: Self-pay

## 2023-09-19 ENCOUNTER — Other Ambulatory Visit (HOSPITAL_BASED_OUTPATIENT_CLINIC_OR_DEPARTMENT_OTHER): Payer: Self-pay

## 2023-09-19 MED ORDER — WEGOVY 2.4 MG/0.75ML ~~LOC~~ SOAJ
2.4000 mg | SUBCUTANEOUS | 0 refills | Status: DC
  Filled 2023-09-19: qty 3, 28d supply, fill #0

## 2023-10-17 ENCOUNTER — Other Ambulatory Visit (HOSPITAL_BASED_OUTPATIENT_CLINIC_OR_DEPARTMENT_OTHER): Payer: Self-pay

## 2023-10-17 MED ORDER — WEGOVY 2.4 MG/0.75ML ~~LOC~~ SOAJ
2.4000 mg | SUBCUTANEOUS | 1 refills | Status: AC
  Filled 2023-10-17: qty 3, 28d supply, fill #0

## 2023-11-14 ENCOUNTER — Other Ambulatory Visit (HOSPITAL_BASED_OUTPATIENT_CLINIC_OR_DEPARTMENT_OTHER): Payer: Self-pay

## 2023-11-14 MED ORDER — WEGOVY 2.4 MG/0.75ML ~~LOC~~ SOAJ
2.4000 mg | SUBCUTANEOUS | 1 refills | Status: AC
Start: 2023-11-14 — End: ?
  Filled 2023-11-14: qty 3, 28d supply, fill #0

## 2023-12-14 ENCOUNTER — Other Ambulatory Visit (HOSPITAL_BASED_OUTPATIENT_CLINIC_OR_DEPARTMENT_OTHER): Payer: Self-pay

## 2023-12-14 MED ORDER — WEGOVY 2.4 MG/0.75ML ~~LOC~~ SOAJ
2.4000 mg | SUBCUTANEOUS | 1 refills | Status: AC
  Filled 2023-12-14: qty 3, 28d supply, fill #0

## 2024-01-11 ENCOUNTER — Other Ambulatory Visit (HOSPITAL_BASED_OUTPATIENT_CLINIC_OR_DEPARTMENT_OTHER): Payer: Self-pay

## 2024-01-11 MED ORDER — WEGOVY 2.4 MG/0.75ML ~~LOC~~ SOAJ
2.4000 mg | SUBCUTANEOUS | 1 refills | Status: AC
Start: 2024-01-11 — End: ?
  Filled 2024-01-11: qty 3, 28d supply, fill #0

## 2024-01-20 ENCOUNTER — Ambulatory Visit
Admission: EM | Admit: 2024-01-20 | Discharge: 2024-01-20 | Disposition: A | Discharge status: Home / Self Care | Attending: Physician Assistant | Admitting: Physician Assistant

## 2024-01-20 DIAGNOSIS — B3731 Acute candidiasis of vulva and vagina: Secondary | ICD-10-CM | POA: Insufficient documentation

## 2024-01-20 DIAGNOSIS — Z113 Encounter for screening for infections with a predominantly sexual mode of transmission: Secondary | ICD-10-CM | POA: Diagnosis present

## 2024-01-20 DIAGNOSIS — N898 Other specified noninflammatory disorders of vagina: Secondary | ICD-10-CM | POA: Diagnosis present

## 2024-01-20 MED ORDER — FLUCONAZOLE 150 MG PO TABS: 150.0000 mg | ORAL_TABLET | Freq: Every day | ORAL | 0 refills | Status: AC

## 2024-01-20 NOTE — Discharge Instructions (Signed)
 Take diflucan as prescribed Will call with test results if we need to change your treatment plan

## 2024-01-20 NOTE — ED Triage Notes (Signed)
 Patient presents to [facility] for vaginal discharge x 2 days. Has not taking any OTC meds.   Denies urinary symptoms or concern for STDs.

## 2024-01-20 NOTE — ED Provider Notes (Signed)
 [name]    CSN: [id number] Arrival date & time: 01/20/24  1011      History   Chief Complaint Chief Complaint  Patient presents with   Vaginal Discharge    HPI Nichole Curry is a 33 y.o. female.   Pt presents with increased vaginal discharge that started a few days ago.  Reports discharge is thick and chunky.  Denies foul odor.  Reports mild intermittent itching.  Denies dysuria, abdominal pain, flank pain, fever chills.  She declines std testing today.      Past Medical History:  Diagnosis Date   Anxiety    Drug use    Gallstones    Hypertension     Patient Active Problem List   Diagnosis Date Noted   Amenorrhea 12/23/2020   GAD (generalized anxiety disorder) 02/27/2019   Moderate episode of recurrent major depressive disorder (HCC) 02/27/2019   Opioid use disorder, severe, dependence (HCC) 02/27/2019   Allergic rhinitis 05/30/2014   Benign essential hypertension 05/30/2014   Cigarette smoker 05/30/2014   Obesity 05/30/2014    Past Surgical History:  Procedure Laterality Date   CESAREAN SECTION      OB History   No obstetric history on file.      Home Medications    Prior to Admission medications   Medication Sig Start Date End Date Taking? Authorizing Provider  cariprazine (VRAYLAR) 1.5 MG capsule Take 1.5 mg by mouth. 12/15/22  Yes [provider]  fluconazole (DIFLUCAN) 150 MG tablet Take 1 tablet (150 mg total) by mouth daily. 01/20/24  Yes Ward, Char Common, PA-C  ondansetron  (ZOFRAN -ODT) 4 MG disintegrating tablet 4mg  ODT q4 hours prn nausea/vomit 05/08/23  Yes Mesner, Jason, MD  alum & mag hydroxide-simeth (MAALOX PLUS) 400-400-40 MG/5ML suspension Take 15 mLs by mouth every 6 (six) hours as needed for indigestion. 05/08/23   Mesner, Jason, MD  amLODipine (NORVASC) 10 MG tablet Take 10 mg by mouth daily.    [provider]  buPROPion (WELLBUTRIN XL) 300 MG 24 hr tablet Take 300 mg by mouth every morning.    [provider]  cloNIDine  (CATAPRES ) 0.2 MG tablet TAKE 1 TABLET(0.2 MG) BY MOUTH TWICE DAILY 07/06/21   Nwoko, Uchenna E, PA  dicyclomine  (BENTYL ) 20 MG tablet Take 1 tablet (20 mg total) by mouth 2 (two) times daily as needed for spasms (abdominal cramping). 05/08/23   Mesner, Reymundo Caulk, MD  EPINEPHrine  0.3 mg/0.3 mL IJ SOAJ injection Inject 0.3 mg into the muscle as needed for anaphylaxis. 12/20/20   Long, Shereen Dike, MD  escitalopram  (LEXAPRO ) 20 MG tablet TAKE 1 TABLET(20 MG) BY MOUTH DAILY 07/06/21   Nwoko, Uchenna E, PA  Melatonin 5 MG CAPS Take 2 capsules (10 mg total) by mouth at bedtime as needed. 06/18/21   Nwoko, Uchenna E, PA  WEGOVY  0.25 MG/0.5ML SOAJ Inject 0.25 mg into the skin once a week at 0900. 04/03/23     WEGOVY  2.4 MG/0.75ML SOAJ Take 2.4 mg by mouth once a week. 10/17/23     WEGOVY  2.4 MG/0.75ML SOAJ Inject 2.4 mg into the skin once a week. 11/14/23     WEGOVY  2.4 MG/0.75ML SOAJ Inject 2.4 mg into the skin once a week. 12/14/23     WEGOVY  2.4 MG/0.75ML SOAJ Inject 2.4 mg as directed once a week. 01/11/24       Family History History reviewed. No pertinent family history.  Social History Social History   Tobacco Use   Smoking status: Former  Current packs/day: 0.50    Average packs/day: 0.5 packs/day for 2.0 years (1.0 ttl pk-yrs)    Types: Cigarettes    Start date: 01/19/2022   Smokeless tobacco: Never  Vaping Use   Vaping status: Never Used  Substance Use Topics   Alcohol use: Not Currently   Drug use: Not Currently    Types: IV, Marijuana    Comment: heroin, crack     Allergies   Black walnut pollen allergy skin test, Pecan extract, Walnut, Cat dander, Cat hair extract, Gramineae pollens, Molds & smuts, Other, Pollen extract, and Morphine   Review of Systems Review of Systems  Constitutional:  Negative for chills and fever.  HENT:  Negative for ear pain and sore throat.   Eyes:  Negative for pain and visual disturbance.  Respiratory:  Negative for cough and  shortness of breath.   Cardiovascular:  Negative for chest pain and palpitations.  Gastrointestinal:  Negative for abdominal pain and vomiting.  Genitourinary:  Positive for vaginal discharge. Negative for dysuria and hematuria.  Musculoskeletal:  Negative for arthralgias and back pain.  Skin:  Negative for color change and rash.  Neurological:  Negative for seizures and syncope.  All other systems reviewed and are negative.    Physical Exam Triage Vital Signs ED Triage Vitals  Encounter Vitals Group     BP 01/20/24 1016 123/81     Systolic BP Percentile --      Diastolic BP Percentile --      Pulse Rate 01/20/24 1016 91     Resp 01/20/24 1016 16     Temp 01/20/24 1016 97.9 F (36.6 C)     Temp Source 01/20/24 1016 Oral     SpO2 01/20/24 1016 97 %     Weight 01/20/24 1016 183 lb (83 kg)     Height 01/20/24 1016 5\' 1"  (1.549 m)     Head Circumference --      Peak Flow --      Pain Score 01/20/24 1025 0     Pain Loc --      Pain Education --      Exclude from Growth Chart --    No data found.  Updated Vital Signs BP 123/81 (BP Location: Right Arm)   Pulse 91   Temp 97.9 F (36.6 C) (Oral)   Resp 16   Ht 5\' 1"  (1.549 m)   Wt 183 lb (83 kg)   LMP 12/29/2023 (Approximate)   SpO2 97%   BMI 34.58 kg/m   Visual Acuity Right Eye Distance:   Left Eye Distance:   Bilateral Distance:    Right Eye Near:   Left Eye Near:    Bilateral Near:     Physical Exam Vitals and nursing note reviewed.  Constitutional:      General: She is not in acute distress.    Appearance: She is well-developed.  HENT:     Head: Normocephalic and atraumatic.  Eyes:     Conjunctiva/sclera: Conjunctivae normal.  Cardiovascular:     Rate and Rhythm: Normal rate and regular rhythm.     Heart sounds: No murmur heard. Pulmonary:     Effort: Pulmonary effort is normal. No respiratory distress.     Breath sounds: Normal breath sounds.  Abdominal:     Palpations: Abdomen is soft.      Tenderness: There is no abdominal tenderness.  Musculoskeletal:        General: No swelling.     Cervical back: Neck supple.  Skin:    General: Skin is warm and dry.     Capillary Refill: Capillary refill takes less than 2 seconds.  Neurological:     Mental Status: She is alert.  Psychiatric:        Mood and Affect: Mood normal.      UC Treatments / Results  Labs (all labs ordered are listed, but only abnormal results are displayed) Labs Reviewed  CERVICOVAGINAL ANCILLARY ONLY    EKG   Radiology No results found.  Procedures Procedures (including critical care time)  Medications Ordered in UC Medications - No data to display  Initial Impression / Assessment and Plan / UC Course  I have reviewed the triage vital signs and the nursing notes.  Pertinent labs & imaging results that were available during my care of the patient were reviewed by me and considered in my medical decision making (see chart for details).     Will treat for vaginal yeast infection.  Cervicovaginal self swab in clinic today, will change treatment plan if indicated.  Declines std testing today.   Final Clinical Impressions(s) / UC Diagnoses   Final diagnoses:  Vaginal yeast infection   Discharge Instructions      Take diflucan as prescribed Will call with test results if we need to change your treatment plan   ED Prescriptions     Medication Sig Dispense Auth. Provider   fluconazole (DIFLUCAN) 150 MG tablet Take 1 tablet (150 mg total) by mouth daily. 1 tablet Ward, Char Common, PA-C      PDMP not reviewed this encounter.   Ward, Char Common, PA-C 01/20/24 1121

## 2024-01-22 LAB — CERVICOVAGINAL ANCILLARY ONLY
Bacterial Vaginitis (gardnerella): NEGATIVE
Candida Glabrata: NEGATIVE
Candida Vaginitis: NEGATIVE
Comment: NEGATIVE
Comment: NEGATIVE
Comment: NEGATIVE

## 2024-01-28 ENCOUNTER — Ambulatory Visit
Admission: EM | Admit: 2024-01-28 | Discharge: 2024-01-28 | Disposition: A | Discharge status: Home / Self Care | Attending: Internal Medicine | Admitting: Internal Medicine

## 2024-01-28 ENCOUNTER — Encounter: Payer: Self-pay | Admitting: Emergency Medicine

## 2024-01-28 DIAGNOSIS — L551 Sunburn of second degree: Secondary | ICD-10-CM | POA: Diagnosis not present

## 2024-01-28 MED ORDER — SILVER SULFADIAZINE 1 % EX CREA: 1.0000 | TOPICAL_CREAM | Freq: Every day | CUTANEOUS | 0 refills | Status: AC

## 2024-01-28 MED ORDER — SILVER SULFADIAZINE 1 % EX CREA: 1.0000 | TOPICAL_CREAM | Freq: Every day | CUTANEOUS | 0 refills | Status: DC

## 2024-01-28 MED ORDER — IBUPROFEN 800 MG PO TABS: 800.0000 mg | ORAL_TABLET | Freq: Three times a day (TID) | ORAL | 0 refills | Status: AC

## 2024-01-28 NOTE — ED Provider Notes (Addendum)
 [facility]    CSN: [id number] Arrival date & time: 01/28/24  1122      History   Chief Complaint Chief Complaint  Patient presents with   Sunburn    HPI Nichole Curry is a 33 y.o. female.   Patient presents to urgent care for evaluation of sunburn to both legs that happened as a result of staying out in the sun 5 days ago.  She was out in the sun for approximately 2 hours 5 days ago without sunscreen to the legs causing severe burn to both legs.  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  Blisters started to form to the right knee and left lateral thigh.  She denies purulent drainage from blisters.  Denies recent fever, chills, nausea, vomiting, headache, and dizziness.  She is tolerating fluids well.  No other burns to the body.  Denies history of immunosuppression.  She has not attempted use of any over-the-counter medications to help with symptoms PTA.     Past Medical History:  Diagnosis Date   Anxiety    Drug use    Gallstones    Hypertension     Patient Active Problem List   Diagnosis Date Noted   Amenorrhea 12/23/2020   GAD (generalized anxiety disorder) 02/27/2019   Moderate episode of recurrent major depressive disorder (HCC) 02/27/2019   Opioid use disorder, severe, dependence (HCC) 02/27/2019   Allergic rhinitis 05/30/2014   Benign essential hypertension 05/30/2014   Cigarette smoker 05/30/2014   Obesity 05/30/2014    Past Surgical History:  Procedure Laterality Date   CESAREAN SECTION      OB History   No obstetric history on file.      Home Medications    Prior to Admission medications   Medication Sig Start Date End Date Taking? Authorizing Provider  ibuprofen  (ADVIL ) 800 MG tablet Take 1 tablet (800 mg total) by mouth 3 (three) times daily. 01/28/24  Yes Starlene Eaton, FNP  alum & mag hydroxide-simeth (MAALOX PLUS) 400-400-40  MG/5ML suspension Take 15 mLs by mouth every 6 (six) hours as needed for indigestion. 05/08/23   Mesner, Jason, MD  amLODipine (NORVASC) 10 MG tablet Take 10 mg by mouth daily.    [provider]  buPROPion (WELLBUTRIN XL) 300 MG 24 hr tablet Take 300 mg by mouth every morning.    [provider]  cariprazine (VRAYLAR) 1.5 MG capsule Take 1.5 mg by mouth. 12/15/22   [provider]  cloNIDine  (CATAPRES ) 0.2 MG tablet TAKE 1 TABLET(0.2 MG) BY MOUTH TWICE DAILY 07/06/21   Nwoko, Uchenna E, PA  dicyclomine  (BENTYL ) 20 MG tablet Take 1 tablet (20 mg total) by mouth 2 (two) times daily as needed for spasms (abdominal cramping). 05/08/23   Mesner, Reymundo Caulk, MD  EPINEPHrine  0.3 mg/0.3 mL IJ SOAJ injection Inject 0.3 mg into the muscle as needed for anaphylaxis. 12/20/20   Long, Joshua G, MD  escitalopram  (LEXAPRO ) 20 MG tablet TAKE 1 TABLET(20 MG) BY MOUTH DAILY 07/06/21   Nwoko, Uchenna E, PA  fluconazole  (DIFLUCAN ) 150 MG tablet Take 1 tablet (150 mg total) by mouth daily. 01/20/24   Ward, Char Common, PA-C  Melatonin 5 MG CAPS Take 2 capsules (10 mg total) by mouth at bedtime as needed. 06/18/21   Nwoko, Uchenna E, PA  ondansetron  (ZOFRAN -ODT) 4 MG disintegrating tablet 4mg  ODT q4 hours prn nausea/vomit 05/08/23  Mesner, Reymundo Caulk, MD  silver sulfADIAZINE (SILVADENE) 1 % cream Apply 1 Application topically daily for 7 days. Apply every 6 hours as needed for sunburn. 01/28/24 02/04/24  Starlene Eaton, FNP  WEGOVY  0.25 MG/0.5ML SOAJ Inject 0.25 mg into the skin once a week at 0900. 04/03/23     WEGOVY  2.4 MG/0.75ML SOAJ Take 2.4 mg by mouth once a week. 10/17/23     WEGOVY  2.4 MG/0.75ML SOAJ Inject 2.4 mg into the skin once a week. 11/14/23     WEGOVY  2.4 MG/0.75ML SOAJ Inject 2.4 mg into the skin once a week. 12/14/23     WEGOVY  2.4 MG/0.75ML SOAJ Inject 2.4 mg as directed once a week. 01/11/24       Family History History reviewed. No pertinent family history.  Social History Social History    Tobacco Use   Smoking status: Former    Current packs/day: 0.50    Average packs/day: 0.5 packs/day for 2.0 years (1.0 ttl pk-yrs)    Types: Cigarettes    Start date: 01/19/2022   Smokeless tobacco: Never  Vaping Use   Vaping status: Never Used  Substance Use Topics   Alcohol use: Not Currently   Drug use: Not Currently    Types: IV, Marijuana    Comment: heroin, crack     Allergies   Black walnut pollen allergy skin test, Pecan extract, Walnut, Cat dander, Cat hair extract, Gramineae pollens, Molds & smuts, Other, Pollen extract, and Morphine   Review of Systems Review of Systems Per HPI  Physical Exam Triage Vital Signs ED Triage Vitals  Encounter Vitals Group     BP 01/28/24 1133 120/83     Systolic BP Percentile --      Diastolic BP Percentile --      Pulse Rate 01/28/24 1133 89     Resp 01/28/24 1133 16     Temp 01/28/24 1133 97.9 F (36.6 C)     Temp src --      SpO2 01/28/24 1133 97 %     Weight --      Height --      Head Circumference --      Peak Flow --      Pain Score 01/28/24 1137 8     Pain Loc --      Pain Education --      Exclude from Growth Chart --    No data found.  Updated Vital Signs BP 120/83 (BP Location: Right Arm)   Pulse 89   Temp 97.9 F (36.6 C)   Resp 16   LMP 01/26/2024 (Exact Date)   SpO2 97%   Visual Acuity Right Eye Distance:   Left Eye Distance:   Bilateral Distance:    Right Eye Near:   Left Eye Near:    Bilateral Near:     Physical Exam Vitals and nursing note reviewed.  Constitutional:      Appearance: She is not ill-appearing or toxic-appearing.  HENT:     Head: Normocephalic and atraumatic.     Right Ear: Hearing and external ear normal.     Left Ear: Hearing and external ear normal.     Nose: Nose normal.     Mouth/Throat:     Lips: Pink.  Eyes:     General: Lids are normal. Vision grossly intact. Gaze aligned appropriately.     Extraocular Movements: Extraocular movements intact.      Conjunctiva/sclera: Conjunctivae normal.  Cardiovascular:     Pulses:  Dorsalis pedis pulses are 2+ on the right side and 2+ on the left side.     Comments: Nonpitting edema to both ankles.  Sensation and strength intact distally. Pulmonary:     Effort: Pulmonary effort is normal.  Musculoskeletal:     Cervical back: Neck supple.  Skin:    General: Skin is warm and dry.     Capillary Refill: Capillary refill takes less than 2 seconds.     Findings: Burn (Second-degree burns to bilateral thighs, first-degree burns to bilateral anterior lower legs and feet.) present. No rash.     Comments: See image of sunburn below.  Neurological:     General: No focal deficit present.     Mental Status: She is alert and oriented to person, place, and time. Mental status is at baseline.     Cranial Nerves: No dysarthria or facial asymmetry.  Psychiatric:        Mood and Affect: Mood normal.        Speech: Speech normal.        Behavior: Behavior normal.        Thought Content: Thought content normal.        Judgment: Judgment normal.      UC Treatments / Results  Labs (all labs ordered are listed, but only abnormal results are displayed) Labs Reviewed - No data to display  EKG   Radiology No results found.  Procedures Procedures (including critical care time)  Medications Ordered in UC Medications - No data to display  Initial Impression / Assessment and Plan / UC Course  I have reviewed the triage vital signs and the nursing notes.  Pertinent labs & imaging results that were available during my care of the patient were reviewed by me and considered in my medical decision making (see chart for details).   1.  Sunburn of second-degree There are areas of the sunburn to both legs that are second-degree burns with blister formation. Will treat this with NSAIDs, Silvadene cream, and infection return precautions.  Recommend cool compresses. Advised to avoid sun exposure for the  next 5 to 7 days while sunburn heals.  Recommend use of sunscreen in the future to prevent sunburns. Recommend follow-up with PCP as needed.  Counseled patient on potential for adverse effects with medications prescribed/recommended today, strict ER and return-to-clinic precautions discussed, patient verbalized understanding.    Final Clinical Impressions(s) / UC Diagnoses   Final diagnoses:  Sunburn of second degree     Discharge Instructions      Apply a generous amount of silvadene cream to the legs every 6 hours as needed for the next 7 days.   Use cool compresses to the legs.  Elevate the legs to reduce swelling.   Watch for signs of bacterial infection including pus drainage from the blisters, fever, and worsening pain.  Take ibuprofen  800mg  every 8 hours as needed for pain and swelling.   If you develop any new or worsening symptoms or if your symptoms do not start to improve, please return here or follow-up with your primary care provider. If your symptoms are severe, please go to the emergency room.    ED Prescriptions     Medication Sig Dispense Auth. Provider   silver sulfADIAZINE (SILVADENE) 1 % cream  (Status: Discontinued) Apply 1 Application topically daily. Apply every 6 hours as needed for sunburn. 100 g Osha Errico M, FNP   ibuprofen  (ADVIL ) 800 MG tablet Take 1 tablet (800 mg total) by mouth  3 (three) times daily. 21 tablet Faizan Geraci M, FNP   silver sulfADIAZINE (SILVADENE) 1 % cream Apply 1 Application topically daily for 7 days. Apply every 6 hours as needed for sunburn. 100 g Starlene Eaton, FNP      PDMP not reviewed this encounter.   Starlene Eaton, FNP 01/28/24 1204    Shella Devoid [place], [place] 01/28/24 1205

## 2024-01-28 NOTE — Discharge Instructions (Addendum)
 Apply a generous amount of silvadene cream to the legs every 6 hours as needed for the next 7 days.   Use cool compresses to the legs.  Elevate the legs to reduce swelling.   Watch for signs of bacterial infection including pus drainage from the blisters, fever, and worsening pain.  Take ibuprofen  800mg  every 8 hours as needed for pain and swelling.   If you develop any new or worsening symptoms or if your symptoms do not start to improve, please return here or follow-up with your primary care provider. If your symptoms are severe, please go to the emergency room.

## 2024-01-28 NOTE — ED Triage Notes (Addendum)
 Pt presents with sunburn on legs and feet. States it is very painful and is starting to blister. She got burnt on wednesday

## 2024-02-07 ENCOUNTER — Other Ambulatory Visit (HOSPITAL_BASED_OUTPATIENT_CLINIC_OR_DEPARTMENT_OTHER): Payer: Self-pay

## 2024-02-07 MED ORDER — WEGOVY 2.4 MG/0.75ML ~~LOC~~ SOAJ
2.4000 mg | SUBCUTANEOUS | 1 refills | Status: AC
  Filled 2024-02-07: qty 3, 28d supply, fill #0

## 2024-03-12 ENCOUNTER — Other Ambulatory Visit (HOSPITAL_BASED_OUTPATIENT_CLINIC_OR_DEPARTMENT_OTHER): Payer: Self-pay

## 2024-03-12 MED ORDER — WEGOVY 2.4 MG/0.75ML ~~LOC~~ SOAJ
2.4000 mg | SUBCUTANEOUS | 1 refills | Status: AC
  Filled 2024-03-12: qty 3, 28d supply, fill #0
  Filled 2024-08-28: qty 3, 28d supply, fill #1

## 2024-04-10 ENCOUNTER — Other Ambulatory Visit (HOSPITAL_BASED_OUTPATIENT_CLINIC_OR_DEPARTMENT_OTHER): Payer: Self-pay

## 2024-04-10 MED ORDER — WEGOVY 2.4 MG/0.75ML ~~LOC~~ SOAJ
2.4000 mg | SUBCUTANEOUS | 1 refills | Status: AC
  Filled 2024-04-10: qty 3, 28d supply, fill #0

## 2024-05-09 ENCOUNTER — Other Ambulatory Visit (HOSPITAL_BASED_OUTPATIENT_CLINIC_OR_DEPARTMENT_OTHER): Payer: Self-pay

## 2024-05-09 MED ORDER — WEGOVY 2.4 MG/0.75ML ~~LOC~~ SOAJ
2.4000 mg | SUBCUTANEOUS | 1 refills | Status: AC
  Filled 2024-05-09: qty 3, 28d supply, fill #0

## 2024-06-05 ENCOUNTER — Other Ambulatory Visit (HOSPITAL_BASED_OUTPATIENT_CLINIC_OR_DEPARTMENT_OTHER): Payer: Self-pay

## 2024-06-05 MED ORDER — WEGOVY 2.4 MG/0.75ML ~~LOC~~ SOAJ
2.4000 mg | SUBCUTANEOUS | 1 refills | Status: AC
  Filled 2024-06-05: qty 3, 28d supply, fill #0

## 2024-07-03 ENCOUNTER — Other Ambulatory Visit (HOSPITAL_BASED_OUTPATIENT_CLINIC_OR_DEPARTMENT_OTHER): Payer: Self-pay

## 2024-07-03 ENCOUNTER — Other Ambulatory Visit: Payer: Self-pay

## 2024-07-03 MED ORDER — WEGOVY 2.4 MG/0.75ML ~~LOC~~ SOAJ
0.7500 mL | SUBCUTANEOUS | 1 refills | Status: AC
  Filled 2024-07-03: qty 3, 28d supply, fill #0

## 2024-07-30 ENCOUNTER — Other Ambulatory Visit (HOSPITAL_BASED_OUTPATIENT_CLINIC_OR_DEPARTMENT_OTHER): Payer: Self-pay

## 2024-07-30 MED ORDER — WEGOVY 2.4 MG/0.75ML ~~LOC~~ SOAJ
SUBCUTANEOUS | 1 refills | Status: AC
  Filled 2024-07-30: qty 3, 28d supply, fill #0

## 2024-08-28 ENCOUNTER — Other Ambulatory Visit (HOSPITAL_BASED_OUTPATIENT_CLINIC_OR_DEPARTMENT_OTHER): Payer: Self-pay

## 2024-08-28 MED ORDER — WEGOVY 2.4 MG/0.75ML ~~LOC~~ SOAJ
2.4000 mL | SUBCUTANEOUS | 1 refills | Status: AC
  Filled 2024-08-28: qty 3, 28d supply, fill #0

## 2024-09-24 ENCOUNTER — Other Ambulatory Visit (HOSPITAL_BASED_OUTPATIENT_CLINIC_OR_DEPARTMENT_OTHER): Payer: Self-pay

## 2024-09-24 MED ORDER — WEGOVY 2.4 MG/0.75ML ~~LOC~~ SOAJ
2.4000 mg | SUBCUTANEOUS | 1 refills | Status: AC
  Filled 2024-09-24: qty 3, 28d supply, fill #0

## 2024-09-25 ENCOUNTER — Other Ambulatory Visit (HOSPITAL_BASED_OUTPATIENT_CLINIC_OR_DEPARTMENT_OTHER): Payer: Self-pay
